# Patient Record
Sex: Female | Born: 1975
Health system: Southern US, Community
[De-identification: ages and names within clinical notes are randomized; demographics above are authoritative.]

## PROBLEM LIST (undated history)

## (undated) DIAGNOSIS — Z8619 Personal history of other infectious and parasitic diseases: Secondary | ICD-10-CM

## (undated) HISTORY — DX: Personal history of other infectious and parasitic diseases: Z86.19

---

## 1998-04-29 ENCOUNTER — Other Ambulatory Visit: Admission: RE | Admit: 1998-04-29 | Discharge: 1998-04-29 | Payer: Self-pay | Admitting: *Deleted

## 2003-09-10 ENCOUNTER — Inpatient Hospital Stay (HOSPITAL_COMMUNITY): Admission: AD | Admit: 2003-09-10 | Discharge: 2003-09-13 | Payer: Self-pay | Admitting: Obstetrics and Gynecology

## 2003-10-10 ENCOUNTER — Other Ambulatory Visit: Admission: RE | Admit: 2003-10-10 | Discharge: 2003-10-10 | Payer: Self-pay | Admitting: Obstetrics and Gynecology

## 2005-10-29 ENCOUNTER — Inpatient Hospital Stay (HOSPITAL_COMMUNITY): Admission: RE | Admit: 2005-10-29 | Discharge: 2005-11-01 | Payer: Self-pay | Admitting: Obstetrics and Gynecology

## 2012-06-28 ENCOUNTER — Ambulatory Visit: Payer: Self-pay | Admitting: Family Medicine

## 2014-06-06 IMAGING — CR DG FOOT COMPLETE 3+V*L*
1 series · 3 of 3 positions shown · non-contrast
Comparison: none

REASON FOR EXAM: left foot pain
COMMENTS:

PROCEDURE:     KDR - KDXR FOOT LT COMP W/OBLIQUES  - June 28, 2012 [DATE]
RESULT:     Comparison:  None

[Series 1: ap · 0.17mm/px · 3 of 3 slices shown]
[im 1/3]
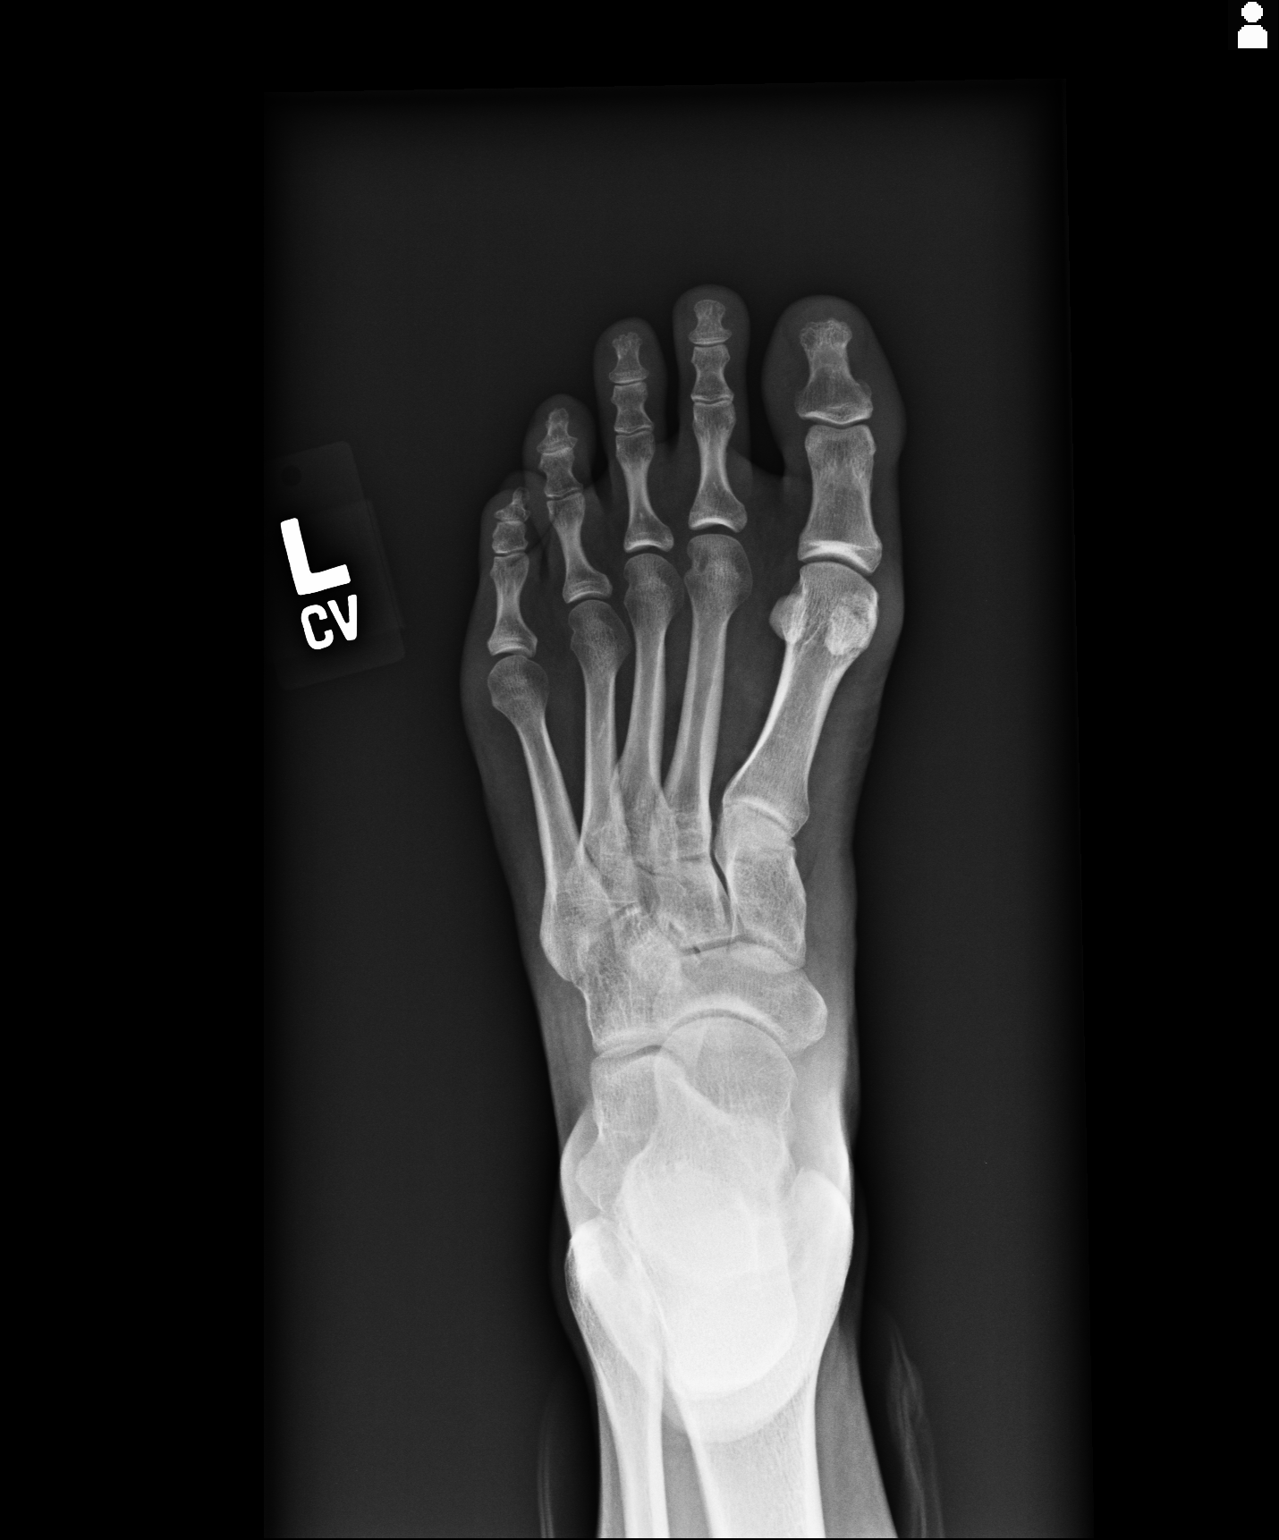
[im 2/3]
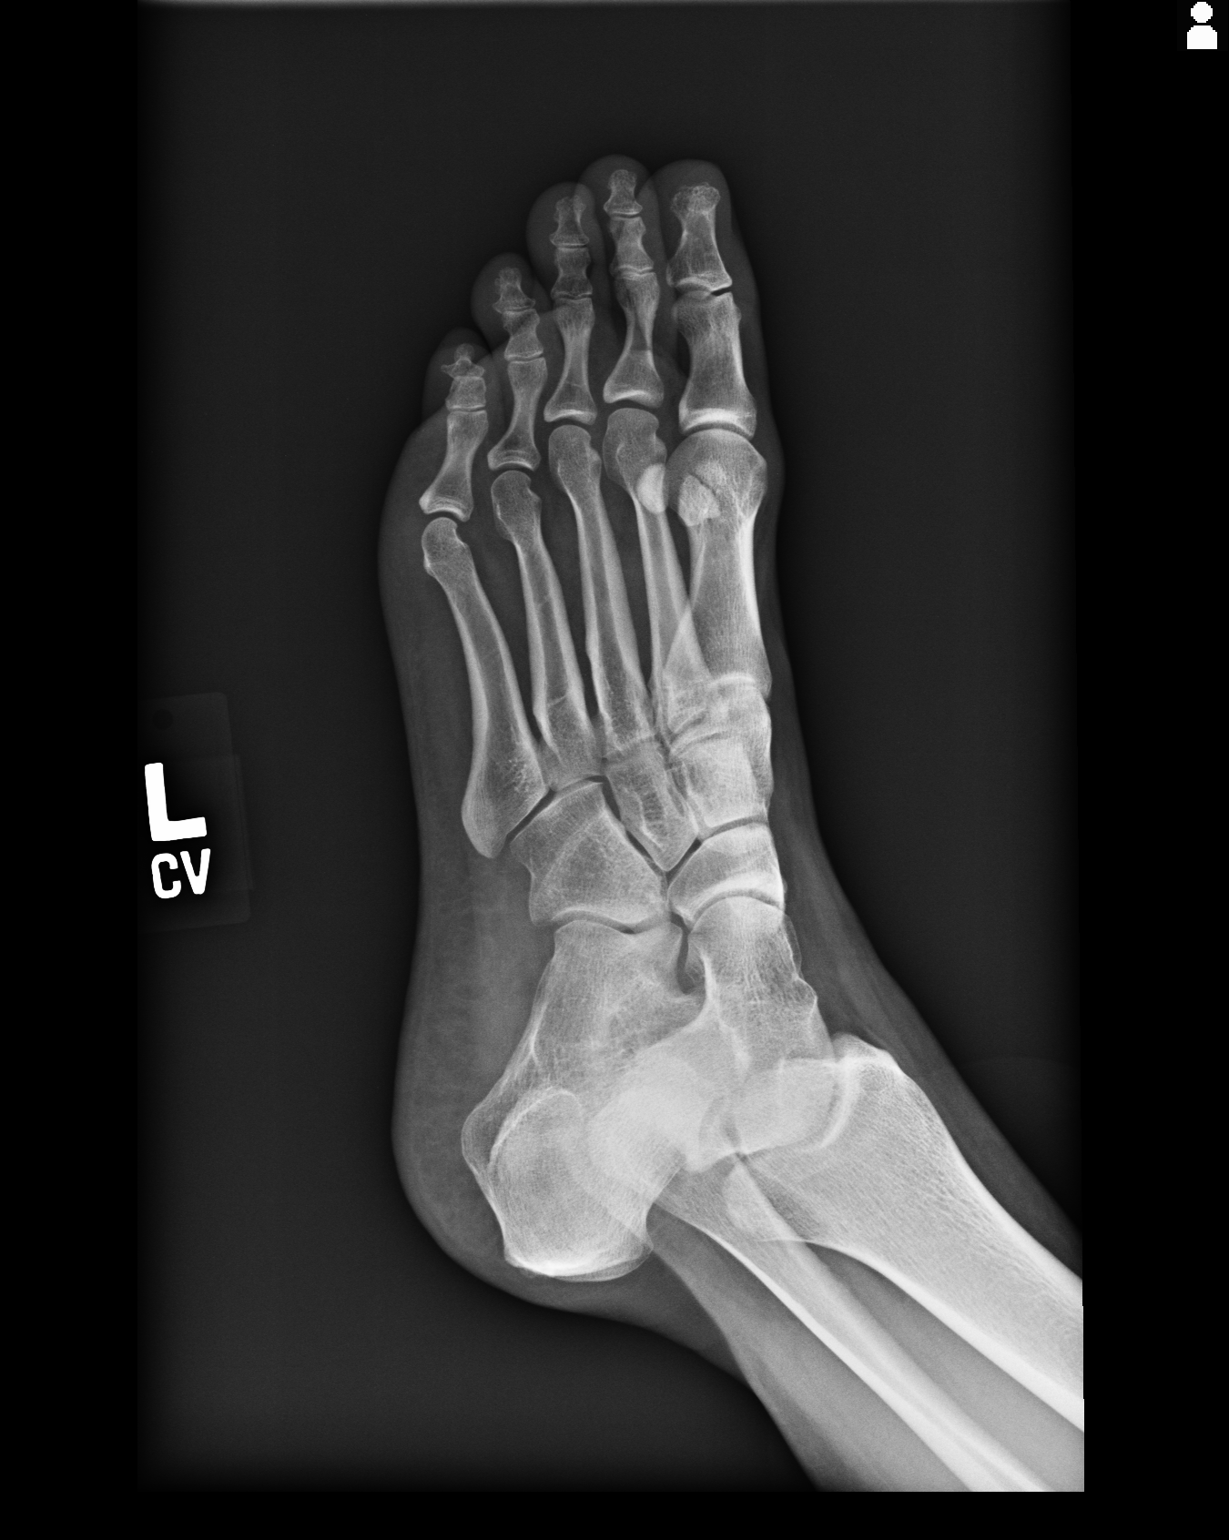
[im 3/3]
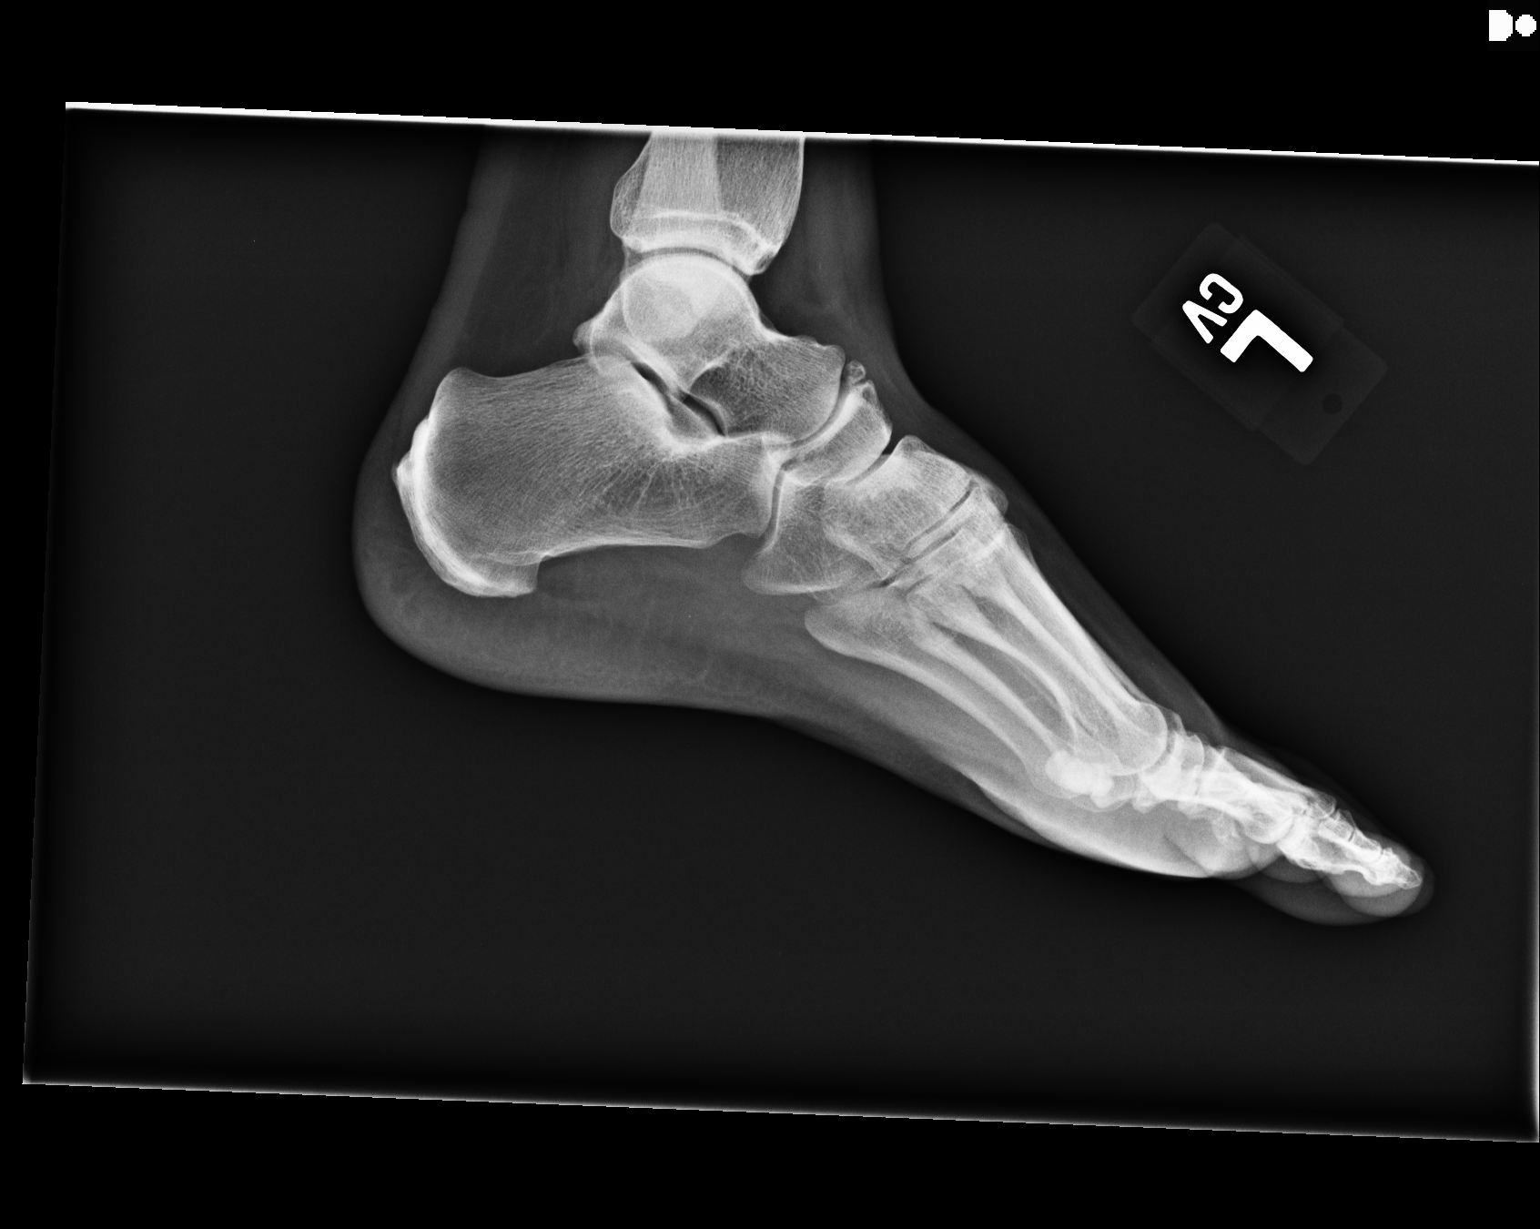

[3 of 3 positions shown; findings below may reference images not displayed]

FINDINGS: AP, oblique, and lateral views of the left foot demonstrates a transverse
lucency through the distal aspect of the medial hallux sesamoid which may
represent a fracture versus a bipartite sesamoid; correlate with point
tenderness. There is no other fracture or dislocation. There is no soft
tissue abnormality. There is no subcutaneous emphysema or radiopaque foreign
bodies.
IMPRESSION: Please see above.

[REDACTED]

## 2015-02-13 LAB — OB RESULTS CONSOLE ABO/RH: RH TYPE: POSITIVE

## 2015-02-13 LAB — OB RESULTS CONSOLE HEPATITIS B SURFACE ANTIGEN: Hepatitis B Surface Ag: NEGATIVE

## 2015-02-13 LAB — OB RESULTS CONSOLE HIV ANTIBODY (ROUTINE TESTING): HIV: NONREACTIVE

## 2015-02-13 LAB — OB RESULTS CONSOLE RPR: RPR: NONREACTIVE

## 2015-02-13 LAB — OB RESULTS CONSOLE ANTIBODY SCREEN: Antibody Screen: NEGATIVE

## 2015-02-13 LAB — OB RESULTS CONSOLE RUBELLA ANTIBODY, IGM: Rubella: IMMUNE

## 2015-02-19 LAB — OB RESULTS CONSOLE GC/CHLAMYDIA
Chlamydia: NEGATIVE
Gonorrhea: NEGATIVE

## 2015-08-14 DIAGNOSIS — O09523 Supervision of elderly multigravida, third trimester: Secondary | ICD-10-CM | POA: Diagnosis not present

## 2015-08-14 DIAGNOSIS — O99810 Abnormal glucose complicating pregnancy: Secondary | ICD-10-CM | POA: Diagnosis not present

## 2015-08-14 DIAGNOSIS — Z3A35 35 weeks gestation of pregnancy: Secondary | ICD-10-CM | POA: Diagnosis not present

## 2015-08-14 LAB — OB RESULTS CONSOLE GBS: STREP GROUP B AG: NEGATIVE

## 2015-08-15 ENCOUNTER — Other Ambulatory Visit: Payer: Self-pay | Admitting: Obstetrics and Gynecology

## 2015-08-25 ENCOUNTER — Encounter (HOSPITAL_COMMUNITY): Payer: Self-pay | Admitting: *Deleted

## 2015-08-25 ENCOUNTER — Telehealth (HOSPITAL_COMMUNITY): Payer: Self-pay | Admitting: *Deleted

## 2015-08-25 NOTE — Telephone Encounter (Signed)
Preadmission screen  

## 2015-08-26 ENCOUNTER — Encounter (HOSPITAL_COMMUNITY): Payer: Self-pay | Admitting: *Deleted

## 2015-08-26 ENCOUNTER — Telehealth (HOSPITAL_COMMUNITY): Payer: Self-pay | Admitting: *Deleted

## 2015-08-26 NOTE — Telephone Encounter (Signed)
Preadmission screen  

## 2015-08-27 ENCOUNTER — Telehealth (HOSPITAL_COMMUNITY): Payer: Self-pay | Admitting: *Deleted

## 2015-08-27 ENCOUNTER — Encounter (HOSPITAL_COMMUNITY): Payer: Self-pay | Admitting: *Deleted

## 2015-08-27 DIAGNOSIS — O365930 Maternal care for other known or suspected poor fetal growth, third trimester, not applicable or unspecified: Secondary | ICD-10-CM | POA: Diagnosis not present

## 2015-08-27 DIAGNOSIS — Z3A37 37 weeks gestation of pregnancy: Secondary | ICD-10-CM | POA: Diagnosis not present

## 2015-08-27 NOTE — Telephone Encounter (Signed)
Preadmission screen  

## 2015-09-05 ENCOUNTER — Encounter (HOSPITAL_COMMUNITY)
Admission: RE | Admit: 2015-09-05 | Discharge: 2015-09-05 | Disposition: A | Discharge status: Home / Self Care | Payer: BLUE CROSS/BLUE SHIELD | Source: Ambulatory Visit | Attending: Obstetrics and Gynecology | Admitting: Obstetrics and Gynecology

## 2015-09-05 DIAGNOSIS — Z833 Family history of diabetes mellitus: Secondary | ICD-10-CM | POA: Diagnosis not present

## 2015-09-05 DIAGNOSIS — Z811 Family history of alcohol abuse and dependence: Secondary | ICD-10-CM | POA: Diagnosis not present

## 2015-09-05 DIAGNOSIS — Z6830 Body mass index (BMI) 30.0-30.9, adult: Secondary | ICD-10-CM | POA: Diagnosis not present

## 2015-09-05 DIAGNOSIS — Z8249 Family history of ischemic heart disease and other diseases of the circulatory system: Secondary | ICD-10-CM | POA: Diagnosis not present

## 2015-09-05 DIAGNOSIS — Z825 Family history of asthma and other chronic lower respiratory diseases: Secondary | ICD-10-CM | POA: Diagnosis not present

## 2015-09-05 DIAGNOSIS — Z3A39 39 weeks gestation of pregnancy: Secondary | ICD-10-CM | POA: Diagnosis not present

## 2015-09-05 DIAGNOSIS — Z818 Family history of other mental and behavioral disorders: Secondary | ICD-10-CM | POA: Diagnosis not present

## 2015-09-05 DIAGNOSIS — Z823 Family history of stroke: Secondary | ICD-10-CM | POA: Diagnosis not present

## 2015-09-05 DIAGNOSIS — O34219 Maternal care for unspecified type scar from previous cesarean delivery: Secondary | ICD-10-CM | POA: Diagnosis not present

## 2015-09-05 DIAGNOSIS — O99214 Obesity complicating childbirth: Secondary | ICD-10-CM | POA: Diagnosis not present

## 2015-09-05 DIAGNOSIS — E669 Obesity, unspecified: Secondary | ICD-10-CM | POA: Diagnosis not present

## 2015-09-05 DIAGNOSIS — O34211 Maternal care for low transverse scar from previous cesarean delivery: Secondary | ICD-10-CM | POA: Diagnosis not present

## 2015-09-05 LAB — CBC
HCT: 36.3 % (ref 36.0–46.0)
Hemoglobin: 12.3 g/dL (ref 12.0–15.0)
MCH: 31.4 pg (ref 26.0–34.0)
MCHC: 33.9 g/dL (ref 30.0–36.0)
MCV: 92.6 fL (ref 78.0–100.0)
PLATELETS: 205 10*3/uL (ref 150–400)
RBC: 3.92 MIL/uL (ref 3.87–5.11)
RDW: 13.3 % (ref 11.5–15.5)
WBC: 8.1 10*3/uL (ref 4.0–10.5)

## 2015-09-05 LAB — TYPE AND SCREEN
ABO/RH(D): O POS
Antibody Screen: NEGATIVE

## 2015-09-05 LAB — ABO/RH: ABO/RH(D): O POS

## 2015-09-05 NOTE — Patient Instructions (Signed)
20 [name]  09/05/2015   Your procedure is scheduled on:  09/08/2015  [facility] at 0815 AM.  Pick up the phone at the desk and dial [phone].   Call this number if you have problems the morning of surgery: [phone]   Remember:   Do not eat food:After Midnight.  Do not drink clear liquids: After Midnight.  Take these medicines the morning of surgery with A SIP OF WATER: none   Do not wear jewelry, make-up or nail polish.  Do not wear lotions, powders, or perfumes. You may wear deodorant.  Do not shave 48 hours prior to surgery.  Do not bring valuables to the hospital.  [facility] is not   responsible for any belongings or valuables brought to the hospital.  Contacts, dentures or bridgework may not be worn into surgery.  Leave suitcase in the car. After surgery it may be brought to your room.  For patients admitted to the hospital, checkout time is 11:00 AM the day of              discharge.   Patients discharged the day of surgery will not be allowed to drive             home.  Name and phone number of your driver: na  Special Instructions:   Shower using CHG 2 nights before surgery and the night before surgery.  If you shower the day of surgery use CHG.  Use special wash - you have one bottle of CHG for all showers.  You should use approximately 1/3 of the bottle for each shower.   Please read over the following fact sheets that you were given:   Surgical Site Infection Prevention

## 2015-09-06 LAB — RPR: RPR: NONREACTIVE

## 2015-09-08 ENCOUNTER — Encounter (HOSPITAL_COMMUNITY)
Admission: RE | Disposition: A | Discharge status: Home / Self Care | Payer: Self-pay | Source: Ambulatory Visit | Attending: Obstetrics and Gynecology

## 2015-09-08 ENCOUNTER — Inpatient Hospital Stay (HOSPITAL_COMMUNITY)
Admission: RE | Admit: 2015-09-08 | Discharge: 2015-09-11 | DRG: 766 | Disposition: A | Discharge status: Home / Self Care | Payer: BLUE CROSS/BLUE SHIELD | Source: Ambulatory Visit | Attending: Obstetrics and Gynecology | Admitting: Obstetrics and Gynecology

## 2015-09-08 ENCOUNTER — Encounter (HOSPITAL_COMMUNITY): Payer: Self-pay

## 2015-09-08 ENCOUNTER — Inpatient Hospital Stay (HOSPITAL_COMMUNITY): Payer: BLUE CROSS/BLUE SHIELD | Admitting: Anesthesiology

## 2015-09-08 DIAGNOSIS — O34219 Maternal care for unspecified type scar from previous cesarean delivery: Secondary | ICD-10-CM | POA: Diagnosis not present

## 2015-09-08 DIAGNOSIS — Z811 Family history of alcohol abuse and dependence: Secondary | ICD-10-CM | POA: Diagnosis not present

## 2015-09-08 DIAGNOSIS — Z3A39 39 weeks gestation of pregnancy: Secondary | ICD-10-CM

## 2015-09-08 DIAGNOSIS — Z823 Family history of stroke: Secondary | ICD-10-CM | POA: Diagnosis not present

## 2015-09-08 DIAGNOSIS — Z8249 Family history of ischemic heart disease and other diseases of the circulatory system: Secondary | ICD-10-CM | POA: Diagnosis not present

## 2015-09-08 DIAGNOSIS — Z825 Family history of asthma and other chronic lower respiratory diseases: Secondary | ICD-10-CM

## 2015-09-08 DIAGNOSIS — Z6830 Body mass index (BMI) 30.0-30.9, adult: Secondary | ICD-10-CM

## 2015-09-08 DIAGNOSIS — O99214 Obesity complicating childbirth: Secondary | ICD-10-CM | POA: Diagnosis present

## 2015-09-08 DIAGNOSIS — Z833 Family history of diabetes mellitus: Secondary | ICD-10-CM

## 2015-09-08 DIAGNOSIS — Z818 Family history of other mental and behavioral disorders: Secondary | ICD-10-CM

## 2015-09-08 DIAGNOSIS — O34211 Maternal care for low transverse scar from previous cesarean delivery: Principal | ICD-10-CM | POA: Diagnosis present

## 2015-09-08 DIAGNOSIS — Z98891 History of uterine scar from previous surgery: Secondary | ICD-10-CM

## 2015-09-08 DIAGNOSIS — E669 Obesity, unspecified: Secondary | ICD-10-CM | POA: Diagnosis present

## 2015-09-08 SURGERY — Surgical Case
Anesthesia: Spinal

## 2015-09-08 MED ORDER — DIBUCAINE 1 % RE OINT: 1.0000 "application " | TOPICAL_OINTMENT | RECTAL | Status: DC | PRN

## 2015-09-08 MED ORDER — MENTHOL 3 MG MT LOZG: 1.0000 | LOZENGE | OROMUCOSAL | Status: DC | PRN

## 2015-09-08 MED ORDER — SODIUM CHLORIDE 0.9% FLUSH: 3.0000 mL | INTRAVENOUS | Status: DC | PRN

## 2015-09-08 MED ORDER — ONDANSETRON HCL 4 MG/2ML IJ SOLN
INTRAMUSCULAR | Status: DC | PRN
  Administered 2015-09-08: 4 mg via INTRAVENOUS

## 2015-09-08 MED ORDER — ACETAMINOPHEN 500 MG PO TABS
1000.0000 mg | ORAL_TABLET | Freq: Four times a day (QID) | ORAL | Status: AC
  Administered 2015-09-08 – 2015-09-09 (×4): 1000 mg via ORAL
  Filled 2015-09-08 (×4): qty 2

## 2015-09-08 MED ORDER — CITRIC ACID-SODIUM CITRATE 334-500 MG/5ML PO SOLN
ORAL | Status: AC
  Administered 2015-09-08: 30 mL via ORAL
  Filled 2015-09-08: qty 15

## 2015-09-08 MED ORDER — SCOPOLAMINE 1 MG/3DAYS TD PT72
MEDICATED_PATCH | TRANSDERMAL | Status: AC
  Administered 2015-09-08: 1.5 mg via TRANSDERMAL
  Filled 2015-09-08: qty 1

## 2015-09-08 MED ORDER — MORPHINE SULFATE (PF) 0.5 MG/ML IJ SOLN
INTRAMUSCULAR | Status: DC | PRN
  Administered 2015-09-08: .2 mg via INTRATHECAL

## 2015-09-08 MED ORDER — PRENATAL MULTIVITAMIN CH
1.0000 | ORAL_TABLET | Freq: Every day | ORAL | Status: DC
  Administered 2015-09-09 – 2015-09-11 (×3): 1 via ORAL
  Filled 2015-09-08 (×3): qty 1

## 2015-09-08 MED ORDER — ONDANSETRON HCL 4 MG/2ML IJ SOLN
INTRAMUSCULAR | Status: AC
  Filled 2015-09-08: qty 2

## 2015-09-08 MED ORDER — NALOXONE HCL 2 MG/2ML IJ SOSY: 1.0000 ug/kg/h | PREFILLED_SYRINGE | INTRAVENOUS | Status: DC | PRN

## 2015-09-08 MED ORDER — SENNOSIDES-DOCUSATE SODIUM 8.6-50 MG PO TABS
2.0000 | ORAL_TABLET | ORAL | Status: DC
  Administered 2015-09-08 – 2015-09-09 (×2): 2 via ORAL
  Filled 2015-09-08 (×3): qty 2

## 2015-09-08 MED ORDER — SIMETHICONE 80 MG PO CHEW: 80.0000 mg | CHEWABLE_TABLET | ORAL | Status: DC | PRN

## 2015-09-08 MED ORDER — SIMETHICONE 80 MG PO CHEW
80.0000 mg | CHEWABLE_TABLET | ORAL | Status: DC
  Administered 2015-09-08 – 2015-09-11 (×3): 80 mg via ORAL
  Filled 2015-09-08 (×3): qty 1

## 2015-09-08 MED ORDER — PHENYLEPHRINE HCL 10 MG/ML IJ SOLN: INTRAMUSCULAR | Status: DC | PRN

## 2015-09-08 MED ORDER — NALOXONE HCL 0.4 MG/ML IJ SOLN
0.4000 mg | INTRAMUSCULAR | Status: DC | PRN
Start: 2015-09-08 — End: 2015-09-09

## 2015-09-08 MED ORDER — ONDANSETRON HCL 4 MG/2ML IJ SOLN: 4.0000 mg | Freq: Three times a day (TID) | INTRAMUSCULAR | Status: DC | PRN

## 2015-09-08 MED ORDER — KETOROLAC TROMETHAMINE 30 MG/ML IJ SOLN
30.0000 mg | Freq: Four times a day (QID) | INTRAMUSCULAR | Status: DC | PRN
  Administered 2015-09-08: 30 mg via INTRAMUSCULAR

## 2015-09-08 MED ORDER — IBUPROFEN 600 MG PO TABS
600.0000 mg | ORAL_TABLET | Freq: Four times a day (QID) | ORAL | Status: DC
  Administered 2015-09-08 – 2015-09-11 (×11): 600 mg via ORAL
  Filled 2015-09-08 (×11): qty 1

## 2015-09-08 MED ORDER — SODIUM CHLORIDE 0.9 % IR SOLN
Status: DC | PRN
  Administered 2015-09-08: 1

## 2015-09-08 MED ORDER — PHENYLEPHRINE 8 MG IN D5W 100 ML (0.08MG/ML) PREMIX OPTIME
INJECTION | INTRAVENOUS | Status: AC
  Filled 2015-09-08: qty 100

## 2015-09-08 MED ORDER — DIPHENHYDRAMINE HCL 50 MG/ML IJ SOLN: 12.5000 mg | INTRAMUSCULAR | Status: DC | PRN

## 2015-09-08 MED ORDER — DIPHENHYDRAMINE HCL 25 MG PO CAPS: 25.0000 mg | ORAL_CAPSULE | ORAL | Status: DC | PRN

## 2015-09-08 MED ORDER — COCONUT OIL OIL: 1.0000 "application " | TOPICAL_OIL | Status: DC | PRN

## 2015-09-08 MED ORDER — NALBUPHINE HCL 10 MG/ML IJ SOLN: 5.0000 mg | INTRAMUSCULAR | Status: DC | PRN

## 2015-09-08 MED ORDER — CITRIC ACID-SODIUM CITRATE 334-500 MG/5ML PO SOLN
30.0000 mL | Freq: Once | ORAL | Status: AC
  Administered 2015-09-08: 30 mL via ORAL

## 2015-09-08 MED ORDER — SCOPOLAMINE 1 MG/3DAYS TD PT72
1.0000 | MEDICATED_PATCH | Freq: Once | TRANSDERMAL | Status: DC
  Administered 2015-09-08: 1.5 mg via TRANSDERMAL

## 2015-09-08 MED ORDER — DIPHENHYDRAMINE HCL 25 MG PO CAPS: 25.0000 mg | ORAL_CAPSULE | Freq: Four times a day (QID) | ORAL | Status: DC | PRN

## 2015-09-08 MED ORDER — FENTANYL CITRATE (PF) 100 MCG/2ML IJ SOLN
INTRAMUSCULAR | Status: DC | PRN
  Administered 2015-09-08: 10 ug via EPIDURAL

## 2015-09-08 MED ORDER — MORPHINE SULFATE (PF) 0.5 MG/ML IJ SOLN
INTRAMUSCULAR | Status: AC
  Filled 2015-09-08: qty 10

## 2015-09-08 MED ORDER — PHENYLEPHRINE 8 MG IN D5W 100 ML (0.08MG/ML) PREMIX OPTIME
INJECTION | INTRAVENOUS | Status: DC | PRN
  Administered 2015-09-08: 60 ug/min via INTRAVENOUS

## 2015-09-08 MED ORDER — BUPIVACAINE HCL (PF) 0.25 % IJ SOLN
INTRAMUSCULAR | Status: AC
  Filled 2015-09-08: qty 10

## 2015-09-08 MED ORDER — TETANUS-DIPHTH-ACELL PERTUSSIS 5-2.5-18.5 LF-MCG/0.5 IM SUSP: 0.5000 mL | Freq: Once | INTRAMUSCULAR | Status: DC

## 2015-09-08 MED ORDER — FENTANYL CITRATE (PF) 100 MCG/2ML IJ SOLN
INTRAMUSCULAR | Status: AC
  Filled 2015-09-08: qty 2

## 2015-09-08 MED ORDER — OXYTOCIN 10 UNIT/ML IJ SOLN
INTRAMUSCULAR | Status: AC
  Filled 2015-09-08: qty 4

## 2015-09-08 MED ORDER — LACTATED RINGERS IV SOLN
INTRAVENOUS | Status: DC | PRN
  Administered 2015-09-08 (×3): via INTRAVENOUS

## 2015-09-08 MED ORDER — BUPIVACAINE HCL (PF) 0.25 % IJ SOLN
INTRAMUSCULAR | Status: DC | PRN
  Administered 2015-09-08: 10 mL

## 2015-09-08 MED ORDER — LACTATED RINGERS IV SOLN
INTRAVENOUS | Status: DC | PRN
  Administered 2015-09-08: 10:00:00 via INTRAVENOUS

## 2015-09-08 MED ORDER — SIMETHICONE 80 MG PO CHEW
80.0000 mg | CHEWABLE_TABLET | Freq: Three times a day (TID) | ORAL | Status: DC
  Administered 2015-09-08 – 2015-09-11 (×9): 80 mg via ORAL
  Filled 2015-09-08 (×9): qty 1

## 2015-09-08 MED ORDER — LACTATED RINGERS IV SOLN
INTRAVENOUS | Status: DC
  Administered 2015-09-08 – 2015-09-09 (×2): via INTRAVENOUS

## 2015-09-08 MED ORDER — ACETAMINOPHEN 325 MG PO TABS
650.0000 mg | ORAL_TABLET | ORAL | Status: DC | PRN
  Administered 2015-09-10 – 2015-09-11 (×4): 650 mg via ORAL
  Filled 2015-09-08 (×4): qty 2

## 2015-09-08 MED ORDER — CEFAZOLIN SODIUM-DEXTROSE 2-4 GM/100ML-% IV SOLN
2.0000 g | INTRAVENOUS | Status: AC
  Administered 2015-09-08: 2 g via INTRAVENOUS
  Filled 2015-09-08: qty 100

## 2015-09-08 MED ORDER — OXYCODONE-ACETAMINOPHEN 5-325 MG PO TABS: 2.0000 | ORAL_TABLET | ORAL | Status: DC | PRN

## 2015-09-08 MED ORDER — NALBUPHINE HCL 10 MG/ML IJ SOLN: 5.0000 mg | Freq: Once | INTRAMUSCULAR | Status: DC | PRN

## 2015-09-08 MED ORDER — PHENYLEPHRINE 40 MCG/ML (10ML) SYRINGE FOR IV PUSH (FOR BLOOD PRESSURE SUPPORT)
PREFILLED_SYRINGE | INTRAVENOUS | Status: AC
Start: 2015-09-08 — End: 2015-09-08
  Filled 2015-09-08: qty 10

## 2015-09-08 MED ORDER — OXYTOCIN 40 UNITS IN LACTATED RINGERS INFUSION - SIMPLE MED
INTRAVENOUS | Status: DC | PRN
  Administered 2015-09-08: 40 [IU] via INTRAVENOUS

## 2015-09-08 MED ORDER — LACTATED RINGERS IV SOLN: Freq: Once | INTRAVENOUS | Status: DC

## 2015-09-08 MED ORDER — KETOROLAC TROMETHAMINE 30 MG/ML IJ SOLN
30.0000 mg | Freq: Four times a day (QID) | INTRAMUSCULAR | Status: DC | PRN
  Administered 2015-09-08: 30 mg via INTRAVENOUS
  Filled 2015-09-08: qty 1

## 2015-09-08 MED ORDER — OXYCODONE-ACETAMINOPHEN 5-325 MG PO TABS
1.0000 | ORAL_TABLET | ORAL | Status: DC | PRN
  Filled 2015-09-08: qty 1

## 2015-09-08 MED ORDER — METHYLERGONOVINE MALEATE 0.2 MG/ML IJ SOLN: 0.2000 mg | INTRAMUSCULAR | Status: DC | PRN

## 2015-09-08 MED ORDER — BUPIVACAINE IN DEXTROSE 0.75-8.25 % IT SOLN
INTRATHECAL | Status: DC | PRN
  Administered 2015-09-08: 1.6 mL via INTRATHECAL

## 2015-09-08 MED ORDER — KETOROLAC TROMETHAMINE 30 MG/ML IJ SOLN
INTRAMUSCULAR | Status: AC
  Administered 2015-09-08: 30 mg via INTRAVENOUS
  Filled 2015-09-08: qty 1

## 2015-09-08 MED ORDER — OXYTOCIN 10 UNIT/ML IJ SOLN: 2.5000 [IU]/h | INTRAVENOUS | Status: DC

## 2015-09-08 MED ORDER — CEFAZOLIN SODIUM-DEXTROSE 2-3 GM-% IV SOLR
INTRAVENOUS | Status: AC
  Filled 2015-09-08: qty 50

## 2015-09-08 MED ORDER — ZOLPIDEM TARTRATE 5 MG PO TABS: 5.0000 mg | ORAL_TABLET | Freq: Every evening | ORAL | Status: DC | PRN

## 2015-09-08 MED ORDER — METHYLERGONOVINE MALEATE 0.2 MG PO TABS: 0.2000 mg | ORAL_TABLET | ORAL | Status: DC | PRN

## 2015-09-08 MED ORDER — WITCH HAZEL-GLYCERIN EX PADS: 1.0000 "application " | MEDICATED_PAD | CUTANEOUS | Status: DC | PRN

## 2015-09-08 SURGICAL SUPPLY — 35 items
CHLORAPREP W/TINT 26ML (MISCELLANEOUS) ×3 IMPLANT
CLAMP CORD UMBIL (MISCELLANEOUS) IMPLANT
CLOTH BEACON ORANGE TIMEOUT ST (SAFETY) ×3 IMPLANT
CONTAINER PREFILL 10% NBF 15ML (MISCELLANEOUS) IMPLANT
DRSG OPSITE POSTOP 4X10 (GAUZE/BANDAGES/DRESSINGS) ×3 IMPLANT
ELECT REM PT RETURN 9FT ADLT (ELECTROSURGICAL) ×3
ELECTRODE REM PT RTRN 9FT ADLT (ELECTROSURGICAL) ×1 IMPLANT
EXTRACTOR VACUUM M CUP 4 TUBE (SUCTIONS) IMPLANT
EXTRACTOR VACUUM M CUP 4' TUBE (SUCTIONS)
GLOVE BIO SURGEON STRL SZ7.5 (GLOVE) ×3 IMPLANT
GLOVE BIOGEL PI IND STRL 7.0 (GLOVE) ×2 IMPLANT
GLOVE BIOGEL PI INDICATOR 7.0 (GLOVE) ×4
GOWN STRL REUS W/TWL LRG LVL3 (GOWN DISPOSABLE) ×9 IMPLANT
KIT ABG SYR 3ML LUER SLIP (SYRINGE) IMPLANT
NDL HYPO 25X5/8 SAFETYGLIDE (NEEDLE) IMPLANT
NDL SPNL 20GX3.5 QUINCKE YW (NEEDLE) IMPLANT
NEEDLE HYPO 22GX1.5 SAFETY (NEEDLE) ×5 IMPLANT
NEEDLE HYPO 25X5/8 SAFETYGLIDE (NEEDLE) IMPLANT
NEEDLE SPNL 20GX3.5 QUINCKE YW (NEEDLE) IMPLANT
NS IRRIG 1000ML POUR BTL (IV SOLUTION) ×3 IMPLANT
PACK C SECTION WH (CUSTOM PROCEDURE TRAY) ×3 IMPLANT
PENCIL SMOKE EVAC W/HOLSTER (ELECTROSURGICAL) ×3 IMPLANT
SUT MNCRL 0 VIOLET CTX 36 (SUTURE) ×2 IMPLANT
SUT MNCRL AB 3-0 PS2 27 (SUTURE) IMPLANT
SUT MON AB 2-0 CT1 27 (SUTURE) ×3 IMPLANT
SUT MON AB-0 CT1 36 (SUTURE) ×6 IMPLANT
SUT MONOCRYL 0 CTX 36 (SUTURE) ×4
SUT PLAIN 0 NONE (SUTURE) IMPLANT
SUT PLAIN 2 0 (SUTURE)
SUT PLAIN 2 0 XLH (SUTURE) IMPLANT
SUT PLAIN ABS 2-0 CT1 27XMFL (SUTURE) IMPLANT
SYR 20CC LL (SYRINGE) IMPLANT
SYR CONTROL 10ML LL (SYRINGE) ×5 IMPLANT
TOWEL OR 17X24 6PK STRL BLUE (TOWEL DISPOSABLE) ×3 IMPLANT
TRAY FOLEY CATH SILVER 14FR (SET/KITS/TRAYS/PACK) ×3 IMPLANT

## 2015-09-08 NOTE — Anesthesia Procedure Notes (Signed)
Spinal Patient location during procedure: OR Staffing Anesthesiologist: Indiana Gamero EDWARD Performed by: anesthesiologist  Preanesthetic Checklist Completed: patient identified, surgical consent, pre-op evaluation, timeout performed, IV checked, risks and benefits discussed and monitors and equipment checked Spinal Block Patient position: sitting Prep: site prepped and draped and DuraPrep Patient monitoring: continuous pulse ox and blood pressure Approach: midline Location: L3-4 Needle Needle type: Pencan  Needle gauge: 24 G Assessment Sensory level: T4 Additional Notes Functioning IV was confirmed and monitors were applied. Sterile prep and drape, including hand hygiene, mask and sterile gloves were used. The patient was positioned and the spine was prepped. The skin was anesthetized with lidocaine.  Free flow of clear CSF was obtained prior to injecting local anesthetic into the CSF.  The spinal needle aspirated freely following injection.  The needle was carefully withdrawn.  The patient tolerated the procedure well. Consent was obtained prior to procedure with all questions answered and concerns addressed. Risks including but not limited to bleeding, infection, nerve damage, paralysis, failed block, inadequate analgesia, allergic reaction, high spinal, itching and headache were discussed and the patient wished to proceed.   Giancarlo Askren, MD   

## 2015-09-08 NOTE — Progress Notes (Signed)
Patient ID: Nancy Hanna, female   DOB: December 04, 1975, 40 y.o.   MRN: [id number] Patient seen and examined. Consent witnessed and signed. No changes noted. Update completed.

## 2015-09-08 NOTE — Op Note (Signed)
Cesarean Section Procedure Note  Indications: previous uterine incision kerr x2  Pre-operative Diagnosis: 39 week 0 day pregnancy.  Post-operative Diagnosis: same  Surgeon: Lenoard AdenAAVON,Purvi Ruehl J   Assistants: RNFA  Anesthesia: Local anesthesia 0.25.% bupivacaine and Spinal anesthesia  ASA Class: 2  Procedure Details  The patient was seen in the Holding Room. The risks, benefits, complications, treatment options, and expected outcomes were discussed with the patient.  The patient concurred with the proposed plan, giving informed consent. The risks of anesthesia, infection, bleeding and possible injury to other organs discussed. Injury to bowel, bladder, or ureter with possible need for repair discussed. Possible need for transfusion with secondary risks of hepatitis or HIV acquisition discussed. Post operative complications to include but not limited to DVT, PE and Pneumonia noted. The site of surgery properly noted/marked. The patient was taken to Operating Room # 9, identified as Nancy Hanna and the procedure verified as C-Section Delivery. A Time Out was held and the above information confirmed.  After induction of anesthesia, the patient was draped and prepped in the usual sterile manner. A Pfannenstiel incision was made and carried down through the subcutaneous tissue to the fascia. Fascial incision was made and extended transversely using Mayo scissors. The fascia was separated from the underlying rectus tissue superiorly and inferiorly. The peritoneum was identified and entered. Peritoneal incision was extended longitudinally. The utero-vesical peritoneal reflection was incised transversely and the bladder flap was bluntly freed from the lower uterine segment. A low transverse uterine incision(Kerr hysterotomy) was made. Delivered from OT presentation was a  Female with Apgar scores of 9 at one minute and 9 at five minutes. Bulb suctioning gently performed. Neonatal team in attendance.After  the umbilical cord was clamped and cut cord blood was obtained for evaluation. The placenta was removed intact and appeared normal. The uterus was curetted with a dry lap pack. Good hemostasis was noted.The uterine outline, tubes and ovaries appeared normal. The uterine incision was closed with running locked sutures of 0 Monocryl x 2 layers. Hemostasis was observed. Lavage was carried out until clear.The parietal peritoneum was closed with a running 2-0 Monocryl suture. The fascia was then reapproximated with running sutures of 0 Monocryl. The skin was reapproximated with 4-0 vicryl after Tennyson closure with 2-0 plain.  Instrument, sponge, and needle counts were correct prior the abdominal closure and at the conclusion of the case.   Findings: FTLM, anterior placenta, nl tubes and ovaries  Estimated Blood Loss:  500         Drains: foley                 Specimens: placenta                 Complications:  None; patient tolerated the procedure well.         Disposition: PACU - hemodynamically stable.         Condition: stable  Attending Attestation: I performed the procedure.

## 2015-09-08 NOTE — H&P (Signed)
Nancy Hanna is a 40 y.o. female presenting for rpt csection. Maternal Medical History:  Fetal activity: Perceived fetal activity is normal.   Last perceived fetal movement was within the past hour.    Prenatal complications: no prenatal complications Prenatal Complications - Diabetes: none.    OB History    Gravida Para Term Preterm AB TAB SAB Ectopic Multiple Living   3 2 1       2      Past Medical History  Diagnosis Date  . Hx of varicella    Past Surgical History  Procedure Laterality Date  . Cesarean section     Family History: family history includes Alcohol abuse in her brother; Bipolar disorder in her brother; COPD in her maternal grandmother; Cancer in her maternal grandmother and mother; Depression in her paternal grandmother; Diabetes in her father; Drug abuse in her brother and maternal grandmother; Heart attack in her father and maternal grandfather; Heart failure in her maternal grandfather; Hypertension in her maternal grandfather; Hypothyroidism in her mother; Migraines in her maternal grandmother and mother; Rheum arthritis in her paternal grandmother; Seizures in her brother; Stroke in her maternal grandfather. Social History:  reports that she has never smoked. She does not have any smokeless tobacco history on file. She reports that she does not drink alcohol or use illicit drugs.   Prenatal Transfer Tool  Maternal Diabetes: No Genetic Screening: Normal Maternal Ultrasounds/Referrals: Normal Fetal Ultrasounds or other Referrals:  None Maternal Substance Abuse:  No Significant Maternal Medications:  None Significant Maternal Lab Results:  None Other Comments:  None  Review of Systems  Constitutional: Negative.   All other systems reviewed and are negative.     There were no vitals taken for this visit. Maternal Exam:  Uterine Assessment: Contraction strength is mild.  Contraction frequency is rare.   Abdomen: Patient reports no abdominal  tenderness. Surgical scars: low transverse.   Fetal presentation: vertex  Introitus: Normal vulva. Normal vagina.  Ferning test: not done.  Nitrazine test: not done. Amniotic fluid character: not assessed.  Pelvis: of concern for delivery.   Cervix: Cervix evaluated by digital exam.     Physical Exam  Nursing note and vitals reviewed. Constitutional: She is oriented to person, place, and time. She appears well-developed and well-nourished.  HENT:  Head: Normocephalic.  Neck: Normal range of motion. Neck supple.  Cardiovascular: Normal rate and regular rhythm.   Respiratory: Effort normal and breath sounds normal.  GI: Soft. Bowel sounds are normal.  Genitourinary: Vagina normal and uterus normal.  Musculoskeletal: Normal range of motion.  Neurological: She is alert and oriented to person, place, and time. She has normal reflexes.  Skin: Skin is warm and dry.  Psychiatric: She has a normal mood and affect.    Prenatal labs: ABO, Rh: --/--/O POS, O POS (04/28 1055) Antibody: NEG (04/28 1055) Rubella: Immune (10/06 0000) RPR: Non Reactive (04/28 1055)  HBsAg: Negative (10/06 0000)  HIV: Non-reactive (10/06 0000)  GBS: Negative (04/06 0000)   Assessment/Plan: 39 wk iUP Previous csection x 2 Rpt csection. Consent done. Risks vs benefits of surgery discussed. Complications outlined and acknowledged.   Charlot Gouin J 09/08/2015, 8:12 AM

## 2015-09-08 NOTE — Transfer of Care (Signed)
Immediate Anesthesia Transfer of Care Note  Patient: Nancy Hanna  Procedure(s) Performed: Procedure(s) with comments: Repeat CESAREAN SECTION (N/A) - EDD: 09/15/15 Felix Ahmadiracey or Heather to RNFA  Patient Location: PACU  Anesthesia Type:Spinal  Level of Consciousness: awake, alert  and oriented  Airway & Oxygen Therapy: Patient Spontanous Breathing  Post-op Assessment: Report given to RN and Post -op Vital signs reviewed and stable  Post vital signs: Reviewed and stable  Last Vitals:  Filed Vitals:   09/08/15 0833  BP: 144/91  Pulse: 80  Temp: 36.7 C  Resp: 16    Last Pain: There were no vitals filed for this visit.       Complications: No apparent anesthesia complications

## 2015-09-08 NOTE — Consult Note (Signed)
Neonatology Note:   Attendance at C-section:    I was asked by Dr. Taavon to attend this repeat C/S at term. The mother is a G3P2, GBS neg with good prenatal care. ROM 0 hours before delivery, fluid clear. Infant vigorous with good spontaneous cry and tone. Needed only minimal bulb suctioning. Ap 8/9. Lungs clear to ausc in DR. Midline sacral dimple noted, base visualized. To CN to care of Pediatrician.    Nancy Hanna C. Carsyn Taubman, MD  

## 2015-09-08 NOTE — Anesthesia Preprocedure Evaluation (Addendum)
Anesthesia Evaluation  Patient identified by MRN, date of birth, ID band Patient awake    Reviewed: Allergy & Precautions, NPO status , Patient's Chart, lab work & pertinent test results  Airway Mallampati: II  TM Distance: >3 FB Neck ROM: Full    Dental  (+) Teeth Intact, Dental Advisory Given   Pulmonary neg pulmonary ROS,    Pulmonary exam normal breath sounds clear to auscultation       Cardiovascular Exercise Tolerance: Good negative cardio ROS Normal cardiovascular exam Rhythm:Regular Rate:Normal     Neuro/Psych negative neurological ROS  negative psych ROS   GI/Hepatic negative GI ROS, Neg liver ROS,   Endo/Other  Obesity   Renal/GU negative Renal ROS     Musculoskeletal negative musculoskeletal ROS (+)   Abdominal   Peds  Hematology negative hematology ROS (+) Plt 205k on 09/05/15   Anesthesia Other Findings Day of surgery medications reviewed with the patient.  Reproductive/Obstetrics (+) Pregnancy H/o 2 previous C-sections                              Anesthesia Physical Anesthesia Plan  ASA: II  Anesthesia Plan: Spinal   Post-op Pain Management:    Induction:   Airway Management Planned: Nasal Cannula  Additional Equipment:   Intra-op Plan:   Post-operative Plan:   Informed Consent: I have reviewed the patients History and Physical, chart, labs and discussed the procedure including the risks, benefits and alternatives for the proposed anesthesia with the patient or authorized representative who has indicated his/her understanding and acceptance.   Dental advisory given  Plan Discussed with: CRNA, Anesthesiologist and Surgeon  Anesthesia Plan Comments: (Discussed risks and benefits of and differences between spinal and general. Discussed risks of spinal including headache, backache, failure, bleeding, infection, and nerve damage. Patient consents to spinal.  Questions answered. Coagulation studies and platelet count acceptable.)       Anesthesia Quick Evaluation

## 2015-09-08 NOTE — Anesthesia Postprocedure Evaluation (Signed)
Anesthesia Post Note  Patient: Nancy Hanna  Procedure(s) Performed: Procedure(s) (LRB): Repeat CESAREAN SECTION (N/A)  Patient location during evaluation: PACU Anesthesia Type: Spinal Level of consciousness: awake and alert and oriented Pain management: pain level controlled Vital Signs Assessment: post-procedure vital signs reviewed and stable Respiratory status: spontaneous breathing, nonlabored ventilation and respiratory function stable Cardiovascular status: blood pressure returned to baseline and stable Postop Assessment: no signs of nausea or vomiting, patient able to bend at knees, spinal receding, no backache and no headache Anesthetic complications: no     Last Vitals:  Filed Vitals:   09/08/15 1220 09/08/15 1230  BP:  133/86  Pulse: 58 58  Temp:    Resp: 18 17    Last Pain: There were no vitals filed for this visit. Pain Goal:                 Wisdom Seybold A.

## 2015-09-09 ENCOUNTER — Encounter (HOSPITAL_COMMUNITY): Payer: Self-pay | Admitting: Obstetrics and Gynecology

## 2015-09-09 LAB — CBC
HEMATOCRIT: 27.4 % — AB (ref 36.0–46.0)
Hemoglobin: 9.2 g/dL — ABNORMAL LOW (ref 12.0–15.0)
MCH: 31 pg (ref 26.0–34.0)
MCHC: 33.6 g/dL (ref 30.0–36.0)
MCV: 92.3 fL (ref 78.0–100.0)
PLATELETS: 151 10*3/uL (ref 150–400)
RBC: 2.97 MIL/uL — ABNORMAL LOW (ref 3.87–5.11)
RDW: 13.4 % (ref 11.5–15.5)
WBC: 7.8 10*3/uL (ref 4.0–10.5)

## 2015-09-09 LAB — BIRTH TISSUE RECOVERY COLLECTION (PLACENTA DONATION)

## 2015-09-09 NOTE — Progress Notes (Signed)
POSTOPERATIVE DAY # 1 S/P CS - repeat elective  S:         Reports feeling well - just a little sore             Tolerating po intake / no nausea / no vomiting / no flatus / no BM             Bleeding is light             Pain controlled withmotrin             Up ad lib / ambulatory/ voiding QS  Newborn breast feeding    O:  VS: BP 111/39 mmHg  Pulse 70  Temp(Src) 97.9 F (36.6 C) (Oral)  Resp 16  SpO2 98%  LMP 12/09/2014  Breastfeeding? Unknown   LABS:               Recent Labs  09/09/15 0604  WBC 7.8  HGB 9.2*  PLT 151               Bloodtype: --/--/O POS, O POS (04/28 1055)  Rubella: Immune (10/06 0000)                                             I&O: Intake/Output      05/01 0701 - 05/02 0700 05/02 0701 - 05/03 0700   P.O. 2320    I.V. 5014.6    Total Intake 7334.6     Urine 3025    Blood 600    Total Output 3625     Net +3709.6                       Physical Exam:             Alert and Oriented X3  Lungs: Clear and unlabored  Heart: regular rate and rhythm / no mumurs  Abdomen: soft, non-tender, non-distended, hypoactive BS             Fundus: firm, non-tender, Ueven             Dressing intact              Incision:  approximated with suture / no erythema / no ecchymosis / marked drainage far left lateral 1/8 saturated  Perineum: intact  Lochia: light  Extremities: 1+ pedal edema, no calf pain or tenderness  A:        POD # 1 S/P CS - repeat             P:        Routine postoperative care                 Marlinda MikeBAILEY, Arik Husmann CNM, MSN, [facility]ACNM 09/09/2015, 7:58 AM

## 2015-09-09 NOTE — Lactation Note (Signed)
This note was copied from a baby's chart. Lactation Consultation Note Follow up visit at 33 hours of age.  RN assisted with syringe feeding of 4ml of formula with NS at breast about 1 hour ago.  Mom attempting feeding now with NS already applied and baby mouthing breast.  Baby is sleepy and not eager to eat.  LC assisted with syringe feeding and baby had strong rhythmic sucking with flow of Formula from syringe and stopped when done. Baby took about 10mls with several breaks to burp.  Baby appears more relaxed, but is noted to be jittery consistent with previous charting and RN aware.  LC assisted with set up of DEBP, mom has used once and needed follow up on teaching.  Encouraged mom to post pump after feedings and work on hand expression after.  Mom to give EBM before formula if she is able to collect colostrum.   Plan mom to use NS for feedings with supplement of 10-3215mls with each feeding and post pump.   Baby remains a poor feeder even with supplementation.    Patient Name: Nancy Hanna WUJWJ'XToday's Date: 09/09/2015 Reason for consult: Follow-up assessment;Difficult latch   Maternal Data Has patient been taught Hand Expression?: Yes Does the patient have breastfeeding experience prior to this delivery?: Yes  Feeding Feeding Type: Formula Length of feed: 5 min  LATCH Score/Interventions Latch: Repeated attempts needed to sustain latch, nipple held in mouth throughout feeding, stimulation needed to elicit sucking reflex. Intervention(s): Adjust position;Assist with latch;Breast massage;Breast compression  Audible Swallowing: A few with stimulation (swallows with formula in syringe) Intervention(s): Skin to skin;Hand expression Intervention(s): Alternate breast massage  Type of Nipple: Everted at rest and after stimulation Intervention(s): Double electric pump  Comfort (Breast/Nipple): Soft / non-tender     Hold (Positioning): Assistance needed to correctly position infant at  breast and maintain latch. Intervention(s): Breastfeeding basics reviewed;Support Pillows;Position options;Skin to skin  LATCH Score: 7  Lactation Tools Discussed/Used Tools: Nipple Shields Nipple shield size: 24 Breast pump type: Double-Electric Breast Pump   Consult Status Consult Status: Follow-up Date: 09/10/15 Follow-up type: In-patient    Beverely RisenShoptaw, Arvella MerlesJana Lynn 09/09/2015, 7:48 PM

## 2015-09-09 NOTE — Lactation Note (Signed)
This note was copied from a baby's chart. Lactation Consultation Note  Patient Name: Nancy Hanna Billsmanda Stohr WUJWJ'XToday's Date: 09/09/2015 Reason for consult: Follow-up assessment Baby  24 hours hours at the start of the consult.  Baby easily awaked and several attempts at breast with the [facility] without a NS and with . Baby seemed  To stayed latched  Longer with the NS #24.  LC assessed tongue mobility with gloved finger and noted oral variance - tongue mobility - short anterior frenulum and doesn't not extend Tongue over gumline, also a high palate. Mom has semi compressible areolas , short shaft nipples, so moms tissue doesn't greet the baby's palate Well and that is why the baby stays latched with the NS better compared to skin to skin.  LC set up the DEBP and had mom pump both breast with #24 flange. Mom comfortable with flange and 2 ml EBM yield obtained - LC fed abby with a spoon 2ml  And baby tolerated well . After appetizer tried re-latching with NS and baby took a few sucks and was sleepy.  LC mentioned to mom since the baby is getting past 24 hours that the calories need are more. This  Afternoon can pump several times and hand express to increase volume.  Probably will need to give formula until volume increases, also hand express.  Debbora PrestoPatti Seibe MBU RN aware of LC plan .    Maternal Data Has patient been taught Hand Expression?: Yes  Feeding Feeding Type: Breast Fed Length of feed: 5 min  LATCH Score/Interventions Latch: Repeated attempts needed to sustain latch, nipple held in mouth throughout feeding, stimulation needed to elicit sucking reflex. Intervention(s): Adjust position;Assist with latch;Breast massage;Breast compression  Audible Swallowing: None  Type of Nipple: Everted at rest and after stimulation (with NS )  Comfort (Breast/Nipple): Soft / non-tender     Hold (Positioning): Assistance needed to correctly position infant at breast and maintain latch. Intervention(s):  Breastfeeding basics reviewed;Support Pillows;Position options;Skin to skin  LATCH Score: 6  Lactation Tools Discussed/Used Tools: Nipple Shields Nipple shield size: 24 Shell Type: Inverted Breast pump type: Double-Electric Breast Pump   Consult Status Consult Status: Follow-up Date: 09/09/15 Follow-up type: In-patient    Kathrin Greathouseorio, Annice Jolly Ann 09/09/2015, 1:04 PM

## 2015-09-10 NOTE — Lactation Note (Signed)
This note was copied from a baby's chart. Lactation Consultation Note; Parents have just finished feeding baby and siblings in to visit baby. Baby under phototherapy.Supplementing with EBM and formula with curved tip syringe at the breast.  Encouraged to page for assist at next feeding.   Patient Name: Nancy Hanna Nancy Stanke UJWJX'BToday's Date: 09/10/2015 Reason for consult: Follow-up assessment   Maternal Data Formula Feeding for Exclusion: No Does the patient have breastfeeding experience prior to this delivery?: Yes  Feeding   LATCH Score/Interventions Latch: Repeated attempts needed to sustain latch, nipple held in mouth throughout feeding, stimulation needed to elicit sucking reflex. Intervention(s): Skin to skin Intervention(s): Adjust position;Assist with latch;Breast massage;Breast compression  Audible Swallowing: A few with stimulation Intervention(s): Skin to skin  Type of Nipple: Flat Intervention(s): Reverse pressure;Shells;Double electric pump  Comfort (Breast/Nipple): Soft / non-tender     Hold (Positioning): Assistance needed to correctly position infant at breast and maintain latch.  LATCH Score: 6  Lactation Tools Discussed/Used Tools: Nipple Shields Nipple shield size: 20 Breast pump type: Double-Electric Breast Pump   Consult Status Consult Status: Follow-up Date: 09/10/15 Follow-up type: In-patient    Pamelia HoitWeeks, Chyann Ambrocio D 09/10/2015, 2:04 PM

## 2015-09-10 NOTE — Lactation Note (Addendum)
This note was copied from a baby's chart. Lactation Consultation Note Baby had been supplementing w/Alimentum in NS w/curve tip syring. Changed to Similac 22 cal. Since baby dropped to 5.15 lbs. Feeding well. Had 6 stools and 3 voids in 38 hrs.  Patient Name: Boy Dawson Billsmanda Rathbone ZOXWR'UToday's Date: 09/10/2015 Reason for consult: Follow-up assessment;Infant weight loss   Maternal Data    Feeding Feeding Type: Breast Milk with Formula added Length of feed: 10 min  LATCH Score/Interventions Latch: Repeated attempts needed to sustain latch, nipple held in mouth throughout feeding, stimulation needed to elicit sucking reflex. Intervention(s): Adjust position;Assist with latch  Audible Swallowing: Spontaneous and intermittent (swallows with formula in syringe)  Type of Nipple: Flat Intervention(s): Double electric pump (nipple shield)  Comfort (Breast/Nipple): Soft / non-tender     Hold (Positioning): Assistance needed to correctly position infant at breast and maintain latch. Intervention(s): Breastfeeding basics reviewed;Support Pillows;Position options;Skin to skin  LATCH Score: 7  Lactation Tools Discussed/Used Tools: Nipple Dorris CarnesShields   Consult Status Consult Status: Follow-up Date: 09/10/15 Follow-up type: In-patient    Evaline Waltman, Diamond NickelLAURA G 09/10/2015, 2:51 AM

## 2015-09-10 NOTE — Progress Notes (Signed)
Patient ID: Nancy Hanna, female   DOB: May 23, 1975, 40 y.o.   MRN: [id number] POD # 2  Subjective: Pt reports feeling well / Pain controlled with ibuprofen Tolerating po/Voiding without problems/ No n/v/Flatus pos Activity: out of bed and ambulate Bleeding is light Newborn info:  Information for the patient's newborn:  Nancy Hanna, Nancy Hanna [[id number]][[id number]]  female  / circ done per Dr Nancy Coastaavon / Feeding: breast Infant with hyper bili and on phototherapy   Objective: VS: BP 131/63 mmHg  Pulse 68  Temp(Src) 97.7 F (36.5 C) (Oral)  Resp 18  SpO2 98%  LABS:  Recent Labs  09/09/15 0604  WBC 7.8  HGB 9.2*  PLT 151     Physical Exam:  General: alert, cooperative and no distress CV: Regular rate and rhythm Resp: clear Abdomen: soft, nontender, normal bowel sounds Uterine Fundus: firm, below umbilicus, nontender Incision: Covered with Tegaderm and honeycomb dressing; well approximated. Lochia: minimal Ext: edema trace and Homans sign is negative, no sign of DVT    A/P: POD # 2 [id number]/ S/P C/Section d/t elective repeat Doing well Continue routine post op orders Anticipate discharge home in the am   Signed: Demetrius Hanna,Nancy Fetherolf K, MSN, [facility] IncWHNP 09/10/2015, 10:06 AM

## 2015-09-10 NOTE — Lactation Note (Signed)
This note was copied from a baby's chart. Lactation Consultation Note  Patient Name: Nancy Hanna ZOXWR'UToday's Date: 09/10/2015 Reason for consult: Follow-up assessment Baby at 54 hr of life and mom requested help with SNS. They have been offering formula with a curved tip syring at the breast but are switching to a SNS so that mom can do feedings without help. Coached FOB through set up of device. Instructed mom on placement of SNS with the NS. Baby was having a hard time getting a deep latch with the NS, he was under mom's breast almost laying flat on his back. Had mom bring him up so he was more on his side and he did much better. He goes to breast well with lots of sucking bursts but after 4-5 minutes he slows then tries to stop around 8 minutes. Parents do a great job of keeping him going for about 10 minutes. Walked FOB through the cleaning and storage of the SNS. They stated they were confident they can duplicate feeding. Encouraged mom to use the DEBP and offer her milk through the SNS as well as the formula in the amounts per guidelines. Mom will offer the breast on demand 8+/24hr with the SNS. She is aware of lactation services and support group.     Maternal Data Formula Feeding for Exclusion: No Does the patient have breastfeeding experience prior to this delivery?: Yes  Feeding Feeding Type: Breast Fed Length of feed: 15 min  LATCH Score/Interventions Latch: Repeated attempts needed to sustain latch, nipple held in mouth throughout feeding, stimulation needed to elicit sucking reflex. Intervention(s): Skin to skin Intervention(s): Adjust position;Assist with latch  Audible Swallowing: Spontaneous and intermittent Intervention(s): Hand expression Intervention(s): Alternate breast massage  Type of Nipple: Flat Intervention(s): Shells  Comfort (Breast/Nipple): Soft / non-tender     Hold (Positioning): Assistance needed to correctly position infant at breast and maintain  latch. Intervention(s): Position options;Support Pillows  LATCH Score: 7  Lactation Tools Discussed/Used Tools: Nipple Dorris CarnesShields;Supplemental Nutrition System Nipple shield size: 20 Shell Type: Inverted Breast pump type: Double-Electric Breast Pump   Consult Status Consult Status: Follow-up Date: 09/11/15 Follow-up type: In-patient    Rulon Eisenmengerlizabeth E Rowan Pollman 09/10/2015, 4:56 PM

## 2015-09-11 MED ORDER — IBUPROFEN 600 MG PO TABS: 600.0000 mg | ORAL_TABLET | Freq: Four times a day (QID) | ORAL | Status: AC

## 2015-09-11 MED ORDER — OXYCODONE-ACETAMINOPHEN 5-325 MG PO TABS: 1.0000 | ORAL_TABLET | ORAL | Status: DC | PRN

## 2015-09-11 NOTE — Discharge Instructions (Signed)
Breast Pumping Tips °If you are breastfeeding, there may be times when you cannot feed your baby directly. Returning to work or going on a trip are common examples. Pumping allows you to store breast milk and feed it to your baby later.  °You may not get much milk when you first start to pump. Your breasts should start to make more after a few days. If you pump at the times you usually feed your baby, you may be able to keep making enough milk to feed your baby without also using formula. The more often you pump, the more milk you will produce.  °WHEN SHOULD I PUMP?  °· You can begin to pump soon after delivery. However, some experts recommend waiting about 4 weeks before giving your infant a bottle to make sure breastfeeding is going well.  °· If you plan to return to work, begin pumping a few weeks before. This will help you develop techniques that work best for you. It also lets you build up a supply of breast milk.   °· When you are with your infant, feed on demand and pump after each feeding.   °· When you are away from your infant for several hours, pump for about 15 minutes every 2-3 hours. Pump both breasts at the same time if you can.   °· If your infant has a formula feeding, make sure to pump around the same time.     °· If you drink any alcohol, wait 2 hours before pumping.   °HOW DO I PREPARE TO PUMP? °Your let-down reflex is the natural reaction to stimulation that makes your breast milk flow. It is easier to stimulate this reflex when you are relaxed. Find relaxation techniques that work for you. If you have difficulty with your let-down reflex, try these methods:  °· Smell one of your infant's blankets or an item of clothing.   °· Look at a picture or video of your infant.   °· Sit in a quiet, private space.   °· Massage the breast you plan to pump.   °· Place soothing warmth on the breast.   °· Play relaxing music.   °WHAT ARE SOME GENERAL BREAST PUMPING TIPS? °· Wash your hands before you pump. You  do not need to wash your nipples or breasts. °· There are three ways to pump. °· You can use your hand to massage and compress your breast. °· You can use a handheld manual pump. °· You can use an electric pump.   °· Make sure the suction cup (flange) on the breast pump is the right size. Place the flange directly over the nipple. If it is the wrong size or placed the wrong way, it may be painful and cause nipple damage.   °· If pumping is uncomfortable, apply a small amount of purified or modified lanolin to your nipple and areola. °· If you are using an electric pump, adjust the speed and suction power to be more comfortable. °· If pumping is painful or if you are not getting very much milk, you may need a different type of pump. A lactation consultant can help you determine what type of pump to use.   °· Keep a full water bottle near you at all times. Drinking lots of fluid helps you make more milk.  °· You can store your milk to use later. Pumped breast milk can be stored in a sealable, sterile container or plastic bag. Label all stored breast milk with the date you pumped it. °· Milk can stay out at room temperature for up to 8 hours. °·   You can store your milk in the refrigerator for up to 8 days. °· You can store your milk in the freezer for 3 months. Thaw frozen milk using warm water. Do not put it in the microwave. °· Do not smoke. Smoking can lower your milk supply and harm your infant. If you need help quitting, ask your health care provider to recommend a program.   °WHEN SHOULD I CALL MY HEALTH CARE PROVIDER OR A LACTATION CONSULTANT? °· You are having trouble pumping. °· You are concerned that you are not making enough milk. °· You have nipple pain, soreness, or redness. °· You want to use birth control. Birth control pills may lower your milk supply. Talk to your health care provider about your options. °  °This information is not intended to replace advice given to you by your health care provider.  Make sure you discuss any questions you have with your health care provider. °  °Document Released: 10/14/2009 Document Revised: 05/01/2013 Document Reviewed: 02/16/2013 °[employer]vier Interactive Patient Education ©2016 [employer] °Postpartum Depression and Baby Blues °The postpartum period begins right after the birth of a baby. During this time, there is often a great amount of joy and excitement. It is also a time of many changes in the life of the parents. Regardless of how many times a mother gives birth, each child brings new challenges and dynamics to the family. It is not unusual to have feelings of excitement along with confusing shifts in moods, emotions, and thoughts. All mothers are at risk of developing postpartum depression or the "baby blues." These mood changes can occur right after giving birth, or they may occur many months after giving birth. The baby blues or postpartum depression can be mild or severe. Additionally, postpartum depression can go away rather quickly, or it can be a long-term condition.  °CAUSES °Raised hormone levels and the rapid drop in those levels are thought to be a main cause of postpartum depression and the baby blues. A number of hormones change during and after pregnancy. Estrogen and progesterone usually decrease right after the delivery of your baby. The levels of thyroid hormone and various cortisol steroids also rapidly drop. Other factors that play a role in these mood changes include major life events and genetics.  °RISK FACTORS °If you have any of the following risks for the baby blues or postpartum depression, know what symptoms to watch out for during the postpartum period. Risk factors that may increase the likelihood of getting the baby blues or postpartum depression include: °· Having a personal or family history of depression.   °· Having depression while being pregnant.   °· Having premenstrual mood issues or mood issues related to oral  contraceptives. °· Having a lot of life stress.   °· Having marital conflict.   °· Lacking a social support network.   °· Having a baby with special needs.   °· Having health problems, such as diabetes.   °SIGNS AND SYMPTOMS °Symptoms of baby blues include: °· Brief changes in mood, such as going from extreme happiness to sadness. °· Decreased concentration.   °· Difficulty sleeping.   °· Crying spells, tearfulness.   °· Irritability.   °· Anxiety.   °Symptoms of postpartum depression typically begin within the first month after giving birth. These symptoms include: °· Difficulty sleeping or excessive sleepiness.   °· Marked weight loss.   °· Agitation.   °· Feelings of worthlessness.   °· Lack of interest in activity or food.   °Postpartum psychosis is a very serious condition and can be dangerous. Fortunately, it is   rare. Displaying any of the following symptoms is cause for immediate medical attention. Symptoms of postpartum psychosis include:  °· Hallucinations and delusions.   °· Bizarre or disorganized behavior.   °· Confusion or disorientation.   °DIAGNOSIS  °A diagnosis is made by an evaluation of your symptoms. There are no medical or lab tests that lead to a diagnosis, but there are various questionnaires that a health care provider may use to identify those with the baby blues, postpartum depression, or psychosis. Often, a screening tool called the Edinburgh Postnatal Depression Scale is used to diagnose depression in the postpartum period.  °TREATMENT °The baby blues usually goes away on its own in 1-2 weeks. Social support is often all that is needed. You will be encouraged to get adequate sleep and rest. Occasionally, you may be given medicines to help you sleep.  °Postpartum depression requires treatment because it can last several months or longer if it is not treated. Treatment may include individual or group therapy, medicine, or both to address any social, physiological, and psychological factors  that may play a role in the depression. Regular exercise, a healthy diet, rest, and social support may also be strongly recommended.  °Postpartum psychosis is more serious and needs treatment right away. Hospitalization is often needed. °HOME CARE INSTRUCTIONS °· Get as much rest as you can. Nap when the baby sleeps.   °· Exercise regularly. Some women find yoga and walking to be beneficial.   °· Eat a balanced and nourishing diet.   °· Do little things that you enjoy. Have a cup of tea, take a bubble bath, read your favorite magazine, or listen to your favorite music. °· Avoid alcohol.   °· Ask for help with household chores, cooking, grocery shopping, or running errands as needed. Do not try to do everything.   °· Talk to people close to you about how you are feeling. Get support from your partner, family members, friends, or other new moms. °· Try to stay positive in how you think. Think about the things you are grateful for.   °· Do not spend a lot of time alone.   °· Only take over-the-counter or prescription medicine as directed by your health care provider. °· Keep all your postpartum appointments.   °· Let your health care provider know if you have any concerns.   °SEEK MEDICAL CARE IF: °You are having a reaction to or problems with your medicine. °SEEK IMMEDIATE MEDICAL CARE IF: °· You have suicidal feelings.   °· You think you may harm the baby or someone else. °MAKE SURE YOU: °· Understand these instructions. °· Will watch your condition. °· Will get help right away if you are not doing well or get worse. °  °This information is not intended to replace advice given to you by your health care provider. Make sure you discuss any questions you have with your health care provider. °  °Document Released: 01/29/2004 Document Revised: 05/01/2013 Document Reviewed: 02/05/2013 °Elsevier Interactive Patient Education ©2016 [employer] °Postpartum Care After Cesarean Delivery °After you deliver your newborn  (postpartum period), the usual stay in the hospital is 24-72 hours. If there were problems with your labor or delivery, or if you have other medical problems, you might be in the hospital longer.  °While you are in the hospital, you will receive help and instructions on how to care for yourself and your newborn during the postpartum period.  °While you are in the hospital: °· It is normal for you to have pain or discomfort from the incision in your   abdomen. Be sure to tell your nurses when you are having pain, where the pain is located, and what makes the pain worse. °· If you are breastfeeding, you may feel uncomfortable contractions of your uterus for a couple of weeks. This is normal. The contractions help your uterus get back to normal size. °· It is normal to have some bleeding after delivery. °· For the first 1-3 days after delivery, the flow is red and the amount may be similar to a period. °· It is common for the flow to start and stop. °· In the first few days, you may pass some small clots. Let your nurses know if you begin to pass large clots or your flow increases. °· Do not  flush blood clots down the toilet before having the nurse look at them. °· During the next 3-10 days after delivery, your flow should become more watery and pink or brown-tinged in color. °· Ten to fourteen days after delivery, your flow should be a small amount of yellowish-white discharge. °· The amount of your flow will decrease over the first few weeks after delivery. Your flow may stop in 6-8 weeks. Most women have had their flow stop by 12 weeks after delivery. °· You should change your sanitary pads frequently. °· Wash your hands thoroughly with soap and water for at least 20 seconds after changing pads, using the toilet, or before holding or feeding your newborn. °· Your intravenous (IV) tubing will be removed when you are drinking enough fluids. °· The urine drainage tube (urinary catheter) that was inserted before delivery  may be removed within 6-8 hours after delivery or when feeling returns to your legs. You should feel like you need to empty your bladder within the first 6-8 hours after the catheter has been removed. °· In case you become weak, lightheaded, or faint, call your nurse before you get out of bed for the first time and before you take a shower for the first time. °· Within the first few days after delivery, your breasts may begin to feel tender and full. This is called engorgement. Breast tenderness usually goes away within 48-72 hours after engorgement occurs. You may also notice milk leaking from your breasts. If you are not breastfeeding, do not stimulate your breasts. Breast stimulation can make your breasts produce more milk. °· Spending as much time as possible with your newborn is very important. During this time, you and your newborn can feel close and get to know each other. Having your newborn stay in your room (rooming in) will help to strengthen the bond with your newborn. It will give you time to get to know your newborn and become comfortable caring for your newborn. °· Your hormones change after delivery. Sometimes the hormone changes can temporarily cause you to feel sad or tearful. These feelings should not last more than a few days. If these feelings last longer than that, you should talk to your caregiver. °· If desired, talk to your caregiver about methods of family planning or contraception. °· Talk to your caregiver about immunizations. Your caregiver may want you to have the following immunizations before leaving the hospital: °· Tetanus, diphtheria, and pertussis (Tdap) or tetanus and diphtheria (Td) immunization. It is very important that you and your family (including grandparents) or others caring for your newborn are up-to-date with the Tdap or Td immunizations. The Tdap or Td immunization can help protect your newborn from getting ill. °· Rubella immunization. °· Varicella (chickenpox)    immunization. °· Influenza immunization. You should receive this annual immunization if you did not receive the immunization during your pregnancy. °  °This information is not intended to replace advice given to you by your health care provider. Make sure you discuss any questions you have with your health care provider. °  °Document Released: 01/19/2012 Document Reviewed: 01/19/2012 °[employer]vier Interactive Patient Education ©2016 [employer] °Breastfeeding and Mastitis °Mastitis is inflammation of the breast tissue. It can occur in women who are breastfeeding. This can make breastfeeding painful. Mastitis will sometimes go away on its own. Your health care provider will help determine if treatment is needed. °CAUSES °Mastitis is often associated with a blocked milk (lactiferous) duct. This can happen when too much milk builds up in the breast. Causes of excess milk in the breast can include: °· Poor latch-on. If your baby is not latched onto the breast properly, she or he may not empty your breast completely while breastfeeding. °· Allowing too much time to pass between feedings. °· Wearing a bra or other clothing that is too tight. This puts extra pressure on the lactiferous ducts so milk does not flow through them as it should. °Mastitis can also be caused by a bacterial infection. Bacteria may enter the breast tissue through cuts or openings in the skin. In women who are breastfeeding, this may occur because of cracked or irritated skin. Cracks in the skin are often caused when your baby does not latch on properly to the breast. °SIGNS AND SYMPTOMS °· Swelling, redness, tenderness, and pain in an area of the breast. °· Swelling of the glands under the arm on the same side. °· Fever may or may not accompany mastitis. °If an infection is allowed to progress, a collection of pus (abscess) may develop. °DIAGNOSIS  °Your health care provider can usually diagnose mastitis based on your symptoms and a physical exam.  Tests may be done to help confirm the diagnosis. These may include: °· Removal of pus from the breast by applying pressure to the area. This pus can be examined in the lab to determine which bacteria are present. If an abscess has developed, the fluid in the abscess can be removed with a needle. This can also be used to confirm the diagnosis and determine the bacteria present. In most cases, pus will not be present. °· Blood tests to determine if your body is fighting a bacterial infection. °· Mammogram or ultrasound tests to rule out other problems or diseases. °TREATMENT  °Mastitis that occurs with breastfeeding will sometimes go away on its own. Your health care provider may choose to wait 24 hours after first seeing you to decide whether a prescription medicine is needed. If your symptoms are worse after 24 hours, your health care provider will likely prescribe an antibiotic medicine to treat the mastitis. He or she will determine which bacteria are most likely causing the infection and will then select an appropriate antibiotic medicine. This is sometimes changed based on the results of tests performed to identify the bacteria, or if there is no response to the antibiotic medicine selected. Antibiotic medicines are usually given by mouth. You may also be given medicine for pain. °HOME CARE INSTRUCTIONS °· Only take over-the-counter or prescription medicines for pain, fever, or discomfort as directed by your health care provider. °· If your health care provider prescribed an antibiotic medicine, take the medicine as directed. Make sure you finish it even if you start to feel better. °· Do not wear a   tight or underwire bra. Wear a soft, supportive bra. °· Increase your fluid intake, especially if you have a fever. °· Continue to empty the breast. Your health care provider can tell you whether this milk is safe for your infant or needs to be thrown out. You may be told to stop nursing until your health care  provider thinks it is safe for your baby. Use a breast pump if you are advised to stop nursing. °· Keep your nipples clean and dry. °· Empty the first breast completely before going to the other breast. If your baby is not emptying your breasts completely for some reason, use a breast pump to empty your breasts. °· If you go back to work, pump your breasts while at work to stay in time with your nursing schedule. °· Avoid allowing your breasts to become overly filled with milk (engorged). °SEEK MEDICAL CARE IF: °· You have pus-like discharge from the breast. °· Your symptoms do not improve with the treatment prescribed by your health care provider within 2 days. °SEEK IMMEDIATE MEDICAL CARE IF: °· Your pain and swelling are getting worse. °· You have pain that is not controlled with medicine. °· You have a red line extending from the breast toward your armpit. °· You have a fever or persistent symptoms for more than 2-3 days. °· You have a fever and your symptoms suddenly get worse. °MAKE SURE YOU:  °· Understand these instructions. °· Will watch your condition. °· Will get help right away if you are not doing well or get worse. °  °This information is not intended to replace advice given to you by your health care provider. Make sure you discuss any questions you have with your health care provider. °  °Document Released: 08/21/2004 Document Revised: 05/01/2013 Document Reviewed: 11/30/2012 °[employer]vier Interactive Patient Education ©2016 [employer] °Breastfeeding °Deciding to breastfeed is one of the best choices you can make for you and your baby. A change in hormones during pregnancy causes your breast tissue to grow and increases the number and size of your milk ducts. These hormones also allow proteins, sugars, and fats from your blood supply to make breast milk in your milk-producing glands. Hormones prevent breast milk from being released before your baby is born as well as prompt milk flow after birth. Once  breastfeeding has begun, thoughts of your baby, as well as his or her sucking or crying, can stimulate the release of milk from your milk-producing glands.  °BENEFITS OF BREASTFEEDING °For Your Baby °· Your first milk (colostrum) helps your baby's digestive system function better. °· There are antibodies in your milk that help your baby fight off infections. °· Your baby has a lower incidence of asthma, allergies, and sudden infant death syndrome. °· The nutrients in breast milk are better for your baby than infant formulas and are designed uniquely for your baby's needs. °· Breast milk improves your baby's brain development. °· Your baby is less likely to develop other conditions, such as childhood obesity, asthma, or type 2 diabetes mellitus. °For You °· Breastfeeding helps to create a very special bond between you and your baby. °· Breastfeeding is convenient. Breast milk is always available at the correct temperature and costs nothing. °· Breastfeeding helps to burn calories and helps you lose the weight gained during pregnancy. °· Breastfeeding makes your uterus contract to its prepregnancy size faster and slows bleeding (lochia) after you give birth.   °· Breastfeeding helps to lower your risk of developing type   2 diabetes mellitus, osteoporosis, and breast or ovarian cancer later in life. °SIGNS THAT YOUR BABY IS HUNGRY °Early Signs of Hunger °· Increased alertness or activity. °· Stretching. °· Movement of the head from side to side. °· Movement of the head and opening of the mouth when the corner of the mouth or cheek is stroked (rooting). °· Increased sucking sounds, smacking lips, cooing, sighing, or squeaking. °· Hand-to-mouth movements. °· Increased sucking of fingers or hands. °Late Signs of Hunger °· Fussing. °· Intermittent crying. °Extreme Signs of Hunger °Signs of extreme hunger will require calming and consoling before your baby will be able to breastfeed successfully. Do not wait for the  following signs of extreme hunger to occur before you initiate breastfeeding: °· Restlessness. °· A loud, strong cry. °· Screaming. °BREASTFEEDING BASICS °Breastfeeding Initiation °· Find a comfortable place to sit or lie down, with your neck and back well supported. °· Place a pillow or rolled up blanket under your baby to bring him or her to the level of your breast (if you are seated). Nursing pillows are specially designed to help support your arms and your baby while you breastfeed. °· Make sure that your baby's abdomen is facing your abdomen. °· Gently massage your breast. With your fingertips, massage from your chest wall toward your nipple in a circular motion. This encourages milk flow. You may need to continue this action during the feeding if your milk flows slowly. °· Support your breast with 4 fingers underneath and your thumb above your nipple. Make sure your fingers are well away from your nipple and your baby's mouth. °· Stroke your baby's lips gently with your finger or nipple. °· When your baby's mouth is open wide enough, quickly bring your baby to your breast, placing your entire nipple and as much of the colored area around your nipple (areola) as possible into your baby's mouth. °· More areola should be visible above your baby's upper lip than below the lower lip. °· Your baby's tongue should be between his or her lower gum and your breast. °· Ensure that your baby's mouth is correctly positioned around your nipple (latched). Your baby's lips should create a seal on your breast and be turned out (everted). °· It is common for your baby to suck about 2-3 minutes in order to start the flow of breast milk. °Latching °Teaching your baby how to latch on to your breast properly is very important. An improper latch can cause nipple pain and decreased milk supply for you and poor weight gain in your baby. Also, if your baby is not latched onto your nipple properly, he or she may swallow some air during  feeding. This can make your baby fussy. Burping your baby when you switch breasts during the feeding can help to get rid of the air. However, teaching your baby to latch on properly is still the best way to prevent fussiness from swallowing air while breastfeeding. °Signs that your baby has successfully latched on to your nipple: °· Silent tugging or silent sucking, without causing you pain. °· Swallowing heard between every 3-4 sucks. °· Muscle movement above and in front of his or her ears while sucking. °Signs that your baby has not successfully latched on to nipple: °· Sucking sounds or smacking sounds from your baby while breastfeeding. °· Nipple pain. °If you think your baby has not latched on correctly, slip your finger into the corner of your baby's mouth to break the suction and place it   between your baby's gums. Attempt breastfeeding initiation again. °Signs of Successful Breastfeeding °Signs from your baby: °· A gradual decrease in the number of sucks or complete cessation of sucking. °· Falling asleep. °· Relaxation of his or her body. °· Retention of a small amount of milk in his or her mouth. °· Letting go of your breast by himself or herself. °Signs from you: °· Breasts that have increased in firmness, weight, and size 1-3 hours after feeding. °· Breasts that are softer immediately after breastfeeding. °· Increased milk volume, as well as a change in milk consistency and color by the fifth day of breastfeeding. °· Nipples that are not sore, cracked, or bleeding. °Signs That Your Baby is Getting Enough Milk °· Wetting at least 3 diapers in a 24-hour period. The urine should be clear and pale yellow by age 5 days. °· At least 3 stools in a 24-hour period by age 5 days. The stool should be soft and yellow. °· At least 3 stools in a 24-hour period by age 7 days. The stool should be seedy and yellow. °· No loss of weight greater than 10% of birth weight during the first 3 days of age. °· Average weight  gain of 4-7 ounces (113-198 g) per week after age 4 days. °· Consistent daily weight gain by age 5 days, without weight loss after the age of 2 weeks. °After a feeding, your baby may spit up a small amount. This is common. °BREASTFEEDING FREQUENCY AND DURATION °Frequent feeding will help you make more milk and can prevent sore nipples and breast engorgement. Breastfeed when you feel the need to reduce the fullness of your breasts or when your baby shows signs of hunger. This is called "breastfeeding on demand." Avoid introducing a pacifier to your baby while you are working to establish breastfeeding (the first 4-6 weeks after your baby is born). After this time you may choose to use a pacifier. Research has shown that pacifier use during the first year of a baby's life decreases the risk of sudden infant death syndrome (SIDS). °Allow your baby to feed on each breast as long as he or she wants. Breastfeed until your baby is finished feeding. When your baby unlatches or falls asleep while feeding from the first breast, offer the second breast. Because newborns are often sleepy in the first few weeks of life, you may need to awaken your baby to get him or her to feed. °Breastfeeding times will vary from baby to baby. However, the following rules can serve as a guide to help you ensure that your baby is properly fed: °· Newborns (babies 4 weeks of age or younger) may breastfeed every 1-3 hours. °· Newborns should not go longer than 3 hours during the day or 5 hours during the night without breastfeeding. °· You should breastfeed your baby a minimum of 8 times in a 24-hour period until you begin to introduce solid foods to your baby at around 6 months of age. °BREAST MILK PUMPING °Pumping and storing breast milk allows you to ensure that your baby is exclusively fed your breast milk, even at times when you are unable to breastfeed. This is especially important if you are going back to work while you are still  breastfeeding or when you are not able to be present during feedings. Your lactation consultant can give you guidelines on how long it is safe to store breast milk. °A breast pump is a machine that allows you to pump milk   from your breast into a sterile bottle. The pumped breast milk can then be stored in a refrigerator or freezer. Some breast pumps are operated by hand, while others use electricity. Ask your lactation consultant which type will work best for you. Breast pumps can be purchased, but some hospitals and breastfeeding support groups lease breast pumps on a monthly basis. A lactation consultant can teach you how to hand express breast milk, if you prefer not to use a pump. °CARING FOR YOUR BREASTS WHILE YOU BREASTFEED °Nipples can become dry, cracked, and sore while breastfeeding. The following recommendations can help keep your breasts moisturized and healthy: °· Avoid using soap on your nipples. °· Wear a supportive bra. Although not required, special nursing bras and tank tops are designed to allow access to your breasts for breastfeeding without taking off your entire bra or top. Avoid wearing underwire-style bras or extremely tight bras. °· Air dry your nipples for 3-4 minutes after each feeding. °· Use only cotton bra pads to absorb leaked breast milk. Leaking of breast milk between feedings is normal. °· Use lanolin on your nipples after breastfeeding. Lanolin helps to maintain your skin's normal moisture barrier. If you use pure lanolin, you do not need to wash it off before feeding your baby again. Pure lanolin is not toxic to your baby. You may also hand express a few drops of breast milk and gently massage that milk into your nipples and allow the milk to air dry. °In the first few weeks after giving birth, some women experience extremely full breasts (engorgement). Engorgement can make your breasts feel heavy, warm, and tender to the touch. Engorgement peaks within 3-5 days after you give  birth. The following recommendations can help ease engorgement: °· Completely empty your breasts while breastfeeding or pumping. You may want to start by applying warm, moist heat (in the shower or with warm water-soaked hand towels) just before feeding or pumping. This increases circulation and helps the milk flow. If your baby does not completely empty your breasts while breastfeeding, pump any extra milk after he or she is finished. °· Wear a snug bra (nursing or regular) or tank top for 1-2 days to signal your body to slightly decrease milk production. °· Apply ice packs to your breasts, unless this is too uncomfortable for you. °· Make sure that your baby is latched on and positioned properly while breastfeeding. °If engorgement persists after 48 hours of following these recommendations, contact your health care provider or a lactation consultant. °OVERALL HEALTH CARE RECOMMENDATIONS WHILE BREASTFEEDING °· Eat healthy foods. Alternate between meals and snacks, eating 3 of each per day. Because what you eat affects your breast milk, some of the foods may make your baby more irritable than usual. Avoid eating these foods if you are sure that they are negatively affecting your baby. °· Drink milk, fruit juice, and water to satisfy your thirst (about 10 glasses a day). °· Rest often, relax, and continue to take your prenatal vitamins to prevent fatigue, stress, and anemia. °· Continue breast self-awareness checks. °· Avoid chewing and smoking tobacco. Chemicals from cigarettes that pass into breast milk and exposure to secondhand smoke may harm your baby. °· Avoid alcohol and drug use, including marijuana. °Some medicines that may be harmful to your baby can pass through breast milk. It is important to ask your health care provider before taking any medicine, including all over-the-counter and prescription medicine as well as vitamin and herbal supplements. °It is possible to become   pregnant while breastfeeding. If  birth control is desired, ask your health care provider about options that will be safe for your baby. °SEEK MEDICAL CARE IF: °· You feel like you want to stop breastfeeding or have become frustrated with breastfeeding. °· You have painful breasts or nipples. °· Your nipples are cracked or bleeding. °· Your breasts are red, tender, or warm. °· You have a swollen area on either breast. °· You have a fever or chills. °· You have nausea or vomiting. °· You have drainage other than breast milk from your nipples. °· Your breasts do not become full before feedings by the fifth day after you give birth. °· You feel sad and depressed. °· Your baby is too sleepy to eat well. °· Your baby is having trouble sleeping.   °· Your baby is wetting less than 3 diapers in a 24-hour period. °· Your baby has less than 3 stools in a 24-hour period. °· Your baby's skin or the white part of his or her eyes becomes yellow.   °· Your baby is not gaining weight by 5 days of age. °SEEK IMMEDIATE MEDICAL CARE IF: °· Your baby is overly tired (lethargic) and does not want to wake up and feed. °· Your baby develops an unexplained fever. °  °This information is not intended to replace advice given to you by your health care provider. Make sure you discuss any questions you have with your health care provider. °  °Document Released: 04/26/2005 Document Revised: 01/15/2015 Document Reviewed: 10/18/2012 °Elsevier Interactive Patient Education ©2016 [employer] ° °

## 2015-09-11 NOTE — Discharge Summary (Signed)
OB Discharge Summary     Patient Name: Nancy Hanna DOB: 01/11/1976 MRN: [id number]  Date of admission: 09/08/2015 Delivering MD: Olivia Mackie   Date of discharge: 09/11/2015  Admitting diagnosis: Previous Cesarean Section x 2 Intrauterine pregnancy: [redacted]w[redacted]d     Secondary diagnosis:  Principal Problem:   Postpartum care following repeat cesarean delivery (5/1) Active Problems:   Previous cesarean section  Additional problems: none     Discharge diagnosis: Term Pregnancy Delivered                                                                                                Post partum procedures:none  Augmentation: none  Complications: None  Hospital course:  Sceduled C/S   40 y.o. yo G4P2003 at [redacted]w[redacted]d was admitted to the hospital 09/08/2015 for scheduled cesarean section with the following indication:Elective Repeat.  Membrane Rupture Time/Date: 10:01 AM ,09/08/2015   Patient delivered a Viable infant.09/08/2015  Details of operation can be found in separate operative note.  Pateint had an uncomplicated postpartum course.  She is ambulating, tolerating a regular diet, passing flatus, and urinating well. Patient is discharged home in stable condition on  09/11/2015          Physical exam  Filed Vitals:   09/09/15 2135 09/10/15 0615 09/10/15 2240 09/11/15 0500  BP: 118/63 131/63 129/74 131/74  Pulse: 83 68 80 68  Temp: 98.2 F (36.8 C) 97.7 F (36.5 C) 98 F (36.7 C) 98.6 F (37 C)  TempSrc: Oral Oral Oral Oral  Resp: SpO2:       General: alert, cooperative and no distress Lochia: appropriate Uterine Fundus: firm, midline, U-2 Incision: Healing well with no significant drainage, No significant erythema, Dressing is clean, dry, and intact, skin well-approximated with sutures DVT Evaluation: No evidence of DVT seen on physical exam. Negative Homan's sign. No cords or calf tenderness. Trace calf/Ankle edema is present Labs: Lab Results  Component  Value Date   WBC 7.8 09/09/2015   HGB 9.2* 09/09/2015   HCT 27.4* 09/09/2015   MCV 92.3 09/09/2015   PLT 151 09/09/2015   No flowsheet data found.  Discharge instruction: per After Visit Summary and "Baby and Me Booklet".  After visit meds:    Medication List    TAKE these medications        ibuprofen 600 MG tablet  Commonly known as:  ADVIL,MOTRIN  Take 1 tablet (600 mg total) by mouth every 6 (six) hours.     multivitamin-prenatal 27-0.8 MG Tabs tablet  Take 1 tablet by mouth daily at 12 noon.     oxyCODONE-acetaminophen 5-325 MG tablet  Commonly known as:  PERCOCET/ROXICET  Take 1 tablet by mouth every 4 (four) hours as needed (pain scale 4-7).        Diet: routine diet  Activity: Advance as tolerated. Pelvic rest for 6 weeks.   Outpatient follow up:6 weeks Follow up Appt:No future appointments. Follow up Visit:No Follow-up on file.  Postpartum contraception: Undecided  Newborn Data: Live born female on 09/08/2015 Birth Weight: 6 lb 7.7 oz (2940  g) APGAR: 8, 9  Baby Feeding: Breast Disposition:home with mother   09/11/2015 Raelyn MoraAWSON, Keanu Lesniak, Judie PetitM, CNM

## 2015-09-11 NOTE — Lactation Note (Signed)
This note was copied from a baby's chart. Lactation Consultation Note  Patient Name: Nancy Hanna: 09/11/2015 Reason for consult: Follow-up assessment;Hyperbilirubinemia;Infant < 6lbs Baby on double photo therapy but will now start single photo therapy. Baby will not be d/c today. Mom trying to latch baby in cradle hold but baby not able to sustain latch, changed to cross cradle but baby still not sustaining depth, dimpling at breast and fussy. Mom stopped using the nipple shield but LC encouraged to use nipple shield to help baby sustain depth. Mom's nipples have short shaft. #24 nipple shield applied and baby sustained the latch with nutritive suckling noted. Demonstrated how to use 5 fr feeding tube/syringe at breast to supplement. Baby took this well.  Encouraged parents to continue to BF with each feeding. Use 5 fr feeding tube/syringe at breast (parents did not like SNS) but if overwhelming then use bottle to supplement. Mom to supplement after each feeding due to 9% weight loss and hyperbilirubinemia,  Encouraged 20 ml with each feeding EBM/formula. Mom to post pump every 3 hours for 15 minutes to have EBM to supplement and encourage milk production.  Mom to use nipple shield to latch, some colostrum visible in nipple shield with this feeding and baby seemed satisfied after BF and supplement. Continue to monitor voids/stools.  Encouraged to call with next feeding.   Maternal Data    Feeding Feeding Type: Breast Milk Length of feed: 20 min  LATCH Score/Interventions Latch: Grasps breast easily, tongue down, lips flanged, rhythmical sucking. (using #24 nipple shield) Intervention(s): Assist with latch  Audible Swallowing: Spontaneous and intermittent (w/supplement at breast)  Type of Nipple: Everted at rest and after stimulation (short nipple shafts bilateral)  Comfort (Breast/Nipple): Soft / non-tender     Hold (Positioning): Assistance needed to correctly  position infant at breast and maintain latch. Intervention(s): Breastfeeding basics reviewed;Support Pillows;Position options;Skin to skin  LATCH Score: 9  Lactation Tools Discussed/Used Tools: Nipple Shields;Pump;69F feeding tube / Syringe;Supplemental Nutrition System Nipple shield size: 20;24 Breast pump type: Double-Electric Breast Pump   Consult Status Consult Status: Follow-up Hanna: 09/11/15 Follow-up type: In-patient    Nancy LevinsGranger, Nancy Hanna 09/11/2015, 10:57 AM

## 2015-09-11 NOTE — Progress Notes (Addendum)
Patient ID: Nancy Hanna, female   DOB: February 02, 1976, 40 y.o.   MRN: [id number] Subjective: S/P Repeat Cesarean Delivery  POD# 3 Information for the patient's newborn:  Nancy Hanna, Nancy Hanna [[id number]][[id number]]  female  / circ done  Reports feeling well. A little sore, but ready to go home. Feeding: breast Patient reports tolerating PO.  Breast symptoms: none - nursing is getting better Pain controlled with ibuprofen (OTC) and narcotic analgesics including Percocet Denies HA/SOB/C/P/N/V/dizziness. Flatus present. (+) BM. She reports vaginal bleeding as normal, without clots.  She is ambulating, urinating without difficult.     Objective:   VS:  Filed Vitals:   09/09/15 2135 09/10/15 0615 09/10/15 2240 09/11/15 0500  BP: 118/63 131/63 129/74 131/74  Pulse: 83 68 80 68  Temp: 98.2 F (36.8 C) 97.7 F (36.5 C) 98 F (36.7 C) 98.6 F (37 C)  TempSrc: Oral Oral Oral Oral  Resp: 20 18 18 18   SpO2:           Recent Labs  09/09/15 0604  WBC 7.8  HGB 9.2*  HCT 27.4*  PLT 151     Blood type: O POS (04/28 1055)  Rubella: Immune (10/06 0000)     Physical Exam:   General: alert, cooperative, fatigued and no distress  Abdomen: soft, nontender, normal bowel sounds  Incision: clean, dry, intact and skin well-approximated with sutures  Uterine Fundus: firm, 2 FB below umbilicus, nontender  Lochia: minimal  Ext: edema trace and Homans sign is negative, no sign of DVT   Assessment/Plan: 40 y.o.   POD# 3.  S/P Cesarean Delivery.  Indications: elective repeat                Principal Problem:   Postpartum care following repeat cesarean delivery (5/1) Active Problems:   Previous cesarean section  Doing well, stable.               Regular diet as tolerated Ambulate Routine post-op care D/C home today  Kenard GowerDAWSON, Yeiren Whitecotton, M, MSN, CNM 09/11/2015, 8:23 AM

## 2016-03-29 DIAGNOSIS — L719 Rosacea, unspecified: Secondary | ICD-10-CM | POA: Diagnosis not present

## 2016-05-20 ENCOUNTER — Encounter: Payer: Self-pay | Admitting: Family Medicine

## 2016-05-20 ENCOUNTER — Ambulatory Visit (INDEPENDENT_AMBULATORY_CARE_PROVIDER_SITE_OTHER): Payer: BLUE CROSS/BLUE SHIELD | Admitting: Family Medicine

## 2016-05-20 VITALS — BP 118/80 | HR 106 | Temp 98.4°F | Resp 16 | Wt 152.8 lb

## 2016-05-20 DIAGNOSIS — M5442 Lumbago with sciatica, left side: Secondary | ICD-10-CM

## 2016-05-20 MED ORDER — PREDNISONE 20 MG PO TABS: ORAL_TABLET | ORAL | 0 refills | Status: DC

## 2016-05-20 NOTE — Patient Instructions (Signed)
Stop ibuprofen or Aleve while on prednisone. May take tylenol up to 3000 mg./day

## 2016-05-20 NOTE — Progress Notes (Signed)
Subjective:     Patient ID: Nancy Hanna, female   DOB: 1976-01-19, 41 y.o.   MRN: [id number]  HPI  Chief Complaint  Patient presents with  . Back Pain    Patient comes in office today with complaints of lower back pain that began two weeks ago. Patient reports that she had been lifting and moving chairs and tables and as of recently has been working out on new exercise machine called the bowflex. Patient states that she has a history of back problems and she has seen chiropractor 5x since 05/12/16 for adjustment. Patient states pain is mostly on the left side radiating down leg and described as burning. Patient has been taking otc Ibuprofen for relief.    Has not used the machine for 1.5 weeks with little improvement. Has been taking 400 mg. Ibuprofen for at least a week. Accompanied by her mother today. No prior surgery but has has chiropractic treatment of prior back strains.   Review of Systems     Objective:   Physical Exam  Constitutional: She appears well-developed and well-nourished. She appears distressed (moderate from pain in low back).  Musculoskeletal:  Muscle strength in lower extremities 5/5. SLR's to 90 degrees without radiation of back pain. Localizes pain to left SI area. Mild tenderness there and in her ischial area.       Assessment:    1. Acute left-sided low back pain with left-sided sciatica - predniSONE (DELTASONE) 20 MG tablet; Taper as follows: 3 pills for 4 days, two pills for 4 days, one pill for four days  Dispense: 24 tablet; Refill: 0    Plan:    Discussed use of Tylenol and to avoid nsaid's while on prednisone. Consider imaging if not improving. Patient defers narcotic medication.

## 2016-05-31 DIAGNOSIS — L719 Rosacea, unspecified: Secondary | ICD-10-CM | POA: Diagnosis not present

## 2016-05-31 DIAGNOSIS — L720 Epidermal cyst: Secondary | ICD-10-CM | POA: Diagnosis not present

## 2016-05-31 DIAGNOSIS — D1720 Benign lipomatous neoplasm of skin and subcutaneous tissue of unspecified limb: Secondary | ICD-10-CM | POA: Diagnosis not present

## 2016-06-21 ENCOUNTER — Ambulatory Visit
Admission: RE | Admit: 2016-06-21 | Discharge: 2016-06-21 | Disposition: A | Discharge status: Home / Self Care | Payer: BLUE CROSS/BLUE SHIELD | Source: Ambulatory Visit | Attending: Family Medicine | Admitting: Family Medicine

## 2016-06-21 ENCOUNTER — Telehealth: Payer: Self-pay

## 2016-06-21 ENCOUNTER — Ambulatory Visit (INDEPENDENT_AMBULATORY_CARE_PROVIDER_SITE_OTHER): Payer: BLUE CROSS/BLUE SHIELD | Admitting: Family Medicine

## 2016-06-21 ENCOUNTER — Encounter: Payer: Self-pay | Admitting: Family Medicine

## 2016-06-21 VITALS — BP 106/74 | HR 104 | Temp 98.2°F | Resp 16 | Wt 153.0 lb

## 2016-06-21 DIAGNOSIS — M5442 Lumbago with sciatica, left side: Secondary | ICD-10-CM | POA: Diagnosis not present

## 2016-06-21 DIAGNOSIS — M545 Low back pain: Secondary | ICD-10-CM | POA: Diagnosis not present

## 2016-06-21 NOTE — Telephone Encounter (Signed)
Patient was advised she wants more detail from the report. Patient asked what exactly is mild spinal curvature and can this be corrected? KW

## 2016-06-21 NOTE — Telephone Encounter (Signed)
Discussed normal x-ray and "gentle curvature" not a cause for alarm.

## 2016-06-21 NOTE — Progress Notes (Signed)
Subjective:     Patient ID: Nancy Hanna, female   DOB: [birth year], 41 y.o.   MRN: [id number]  HPI  Chief Complaint  Patient presents with  . Back Pain    Left low back pain x 2 days. Pt has tried ice and ibuprofen, with some relief. Pt was seen on 05/20/2016 for acute left sided low back pain with sciatica, and was put on Prednisone, with relief. Pt reports the pain feels similar.  States she lifted her child while in the car seat prior to the onset of back pain. Reports pain "not as bad as before" but does radiate down her ankle.   Review of Systems     Objective:   Physical Exam  Constitutional: She appears well-developed and well-nourished. No distress.  Musculoskeletal:  Muscle strength in lower extremities 5/5. SLR's to 90 degrees without radiation of back pain. Reports increased pain in left ischial area. Localizes back pain to left SI area.       Assessment:    1. Acute left-sided low back pain with left-sided sciatica: recurrent - DG Lumbar Spine Complete; Future    Plan:    Wishes to hold off on prednisone. Will continue Advil and add Tylenol E.S. Further f/u and possible orthopedic referral pending x-ray report.

## 2016-06-21 NOTE — Patient Instructions (Addendum)
May Tylenol extra strength 500 mg.two pills 3 x day along with Ibuprofen 400 mg. Every 4-6 hours. We will call you with the x-ray report.

## 2016-06-21 NOTE — Telephone Encounter (Signed)
-----   Message from Anola Gurneyobert Chauvin, [place] sent at 06/21/2016  2:01 PM EST ----- Mild spinal curvature but no significant bony abnormality. Give your back two weeks to get better. If getting worse or not improving call for prednisone and/or orthopedic referral.

## 2018-05-19 DIAGNOSIS — Z23 Encounter for immunization: Secondary | ICD-10-CM | POA: Diagnosis not present

## 2018-05-30 IMAGING — CR DG LUMBAR SPINE COMPLETE 4+V
1 series · 5 of 5 positions shown · non-contrast
Comparison: None in PACs

CLINICAL DATA: Left low back pain for the past month radiating down
the left lower extremity without known injury.

EXAM:
LUMBAR SPINE - COMPLETE 4+ VIEW

[Series 1: dg lumbar spine complete 4 +v · 0.14mm/px · 5 of 5 slices shown]
[im 1/5]
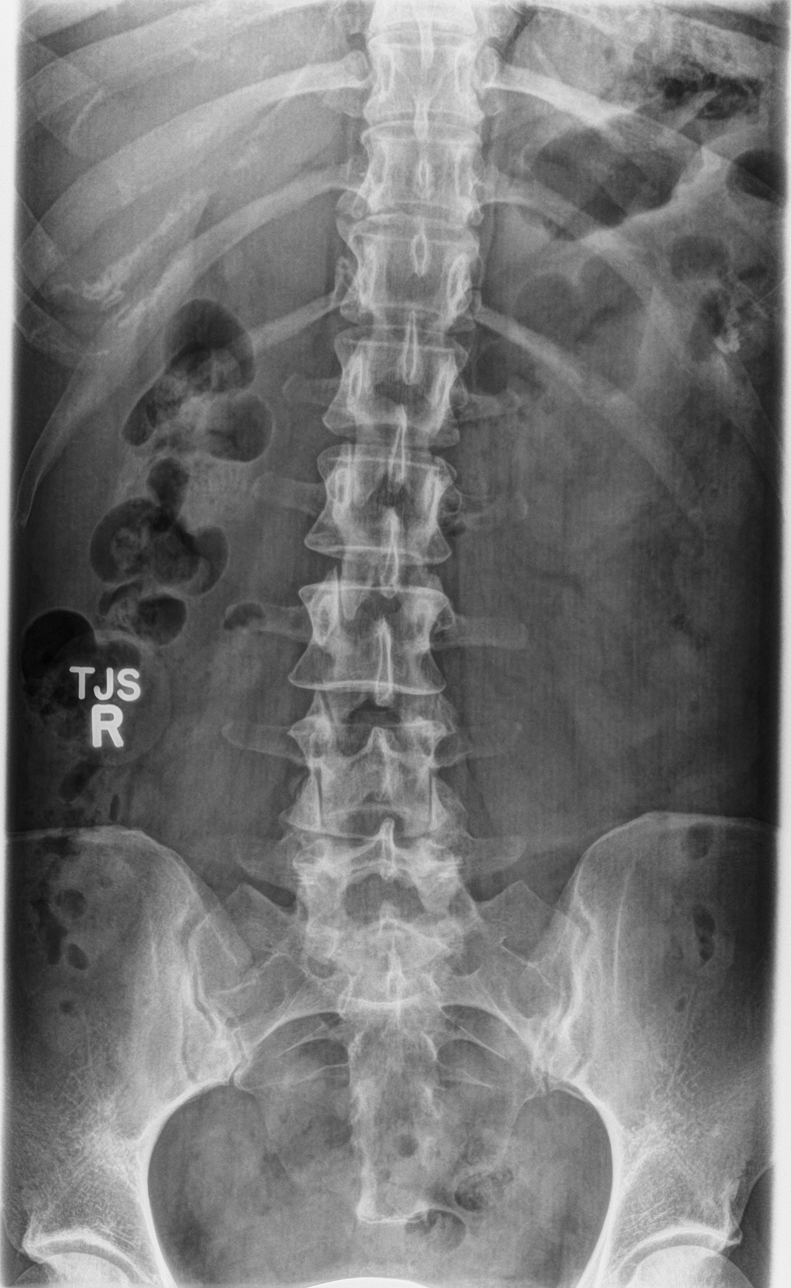
[im 2/5]
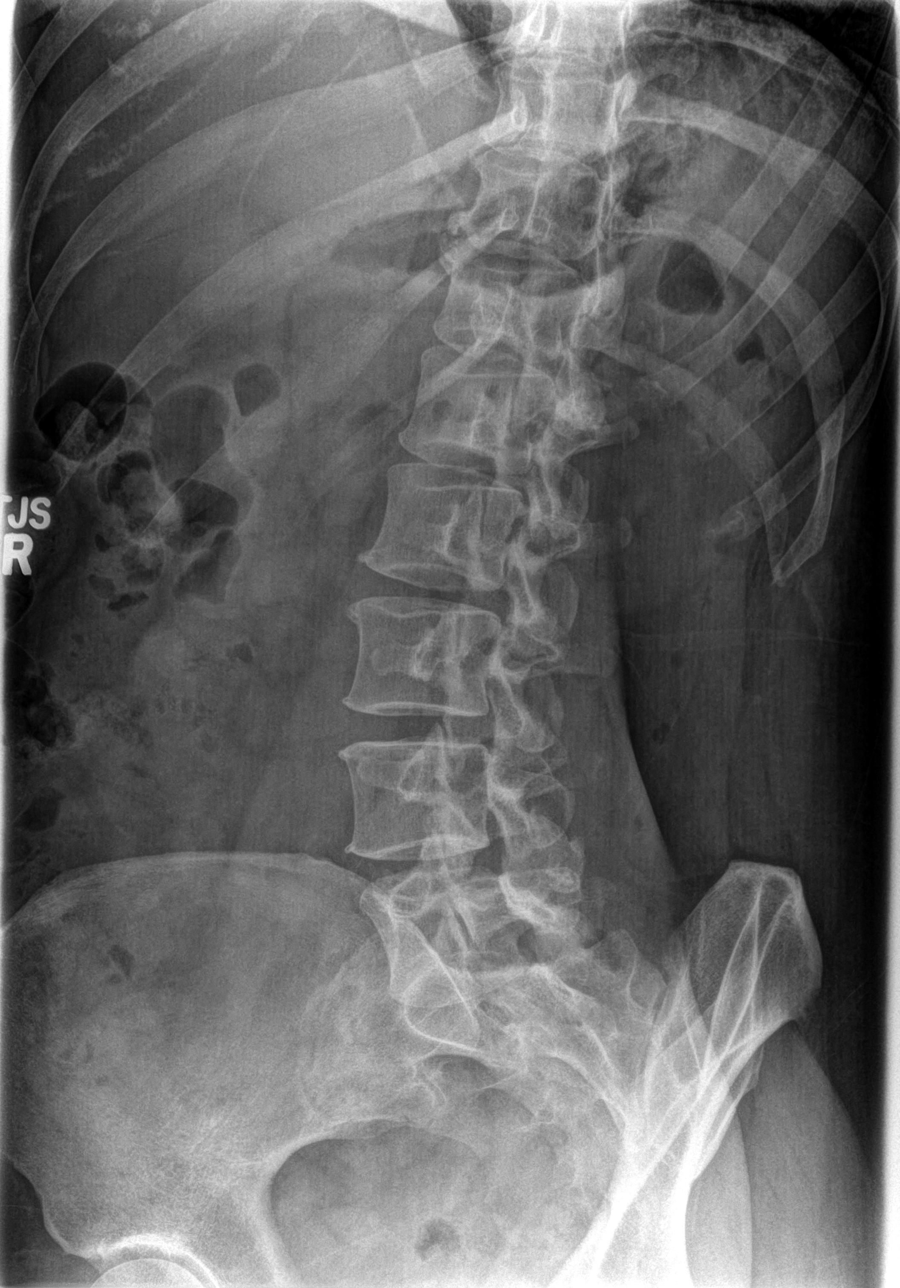
[im 3/5]
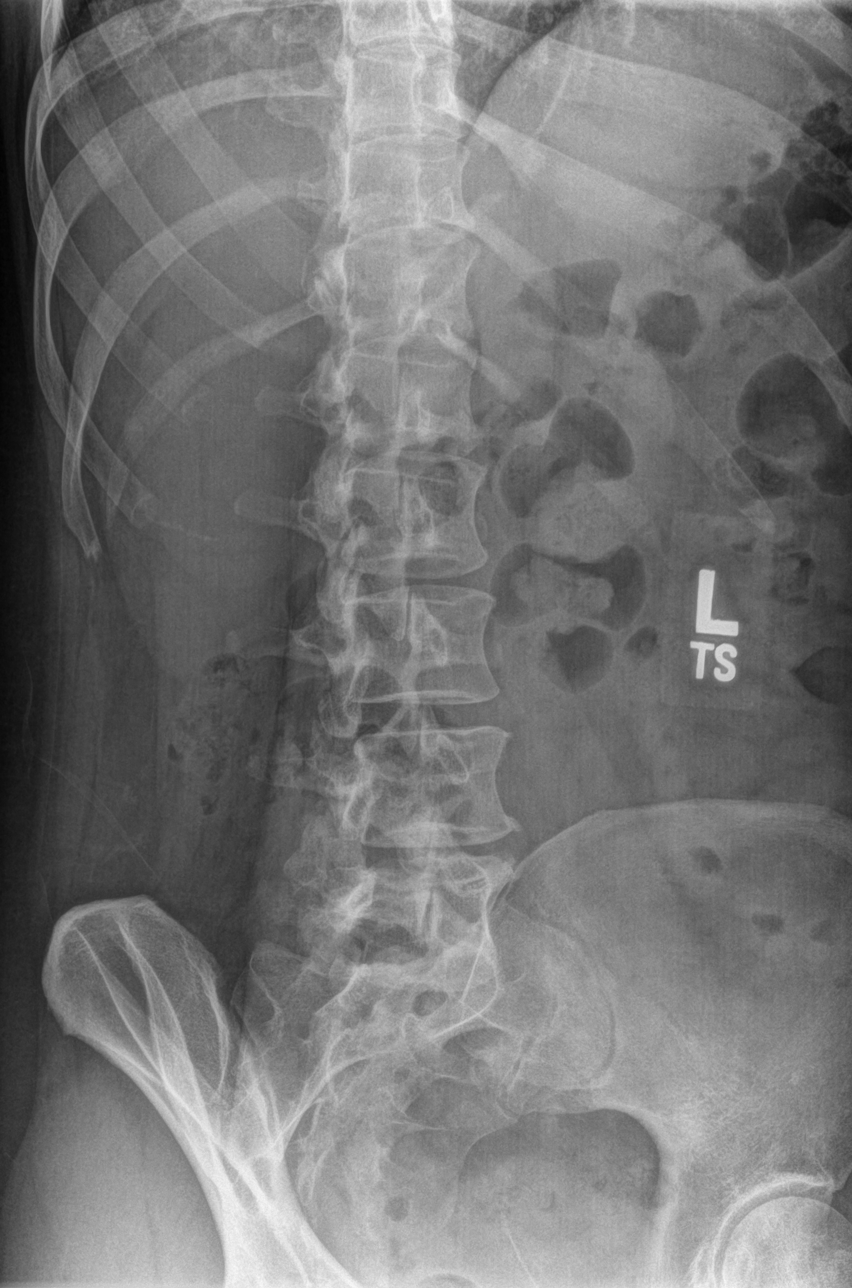
[im 4/5]
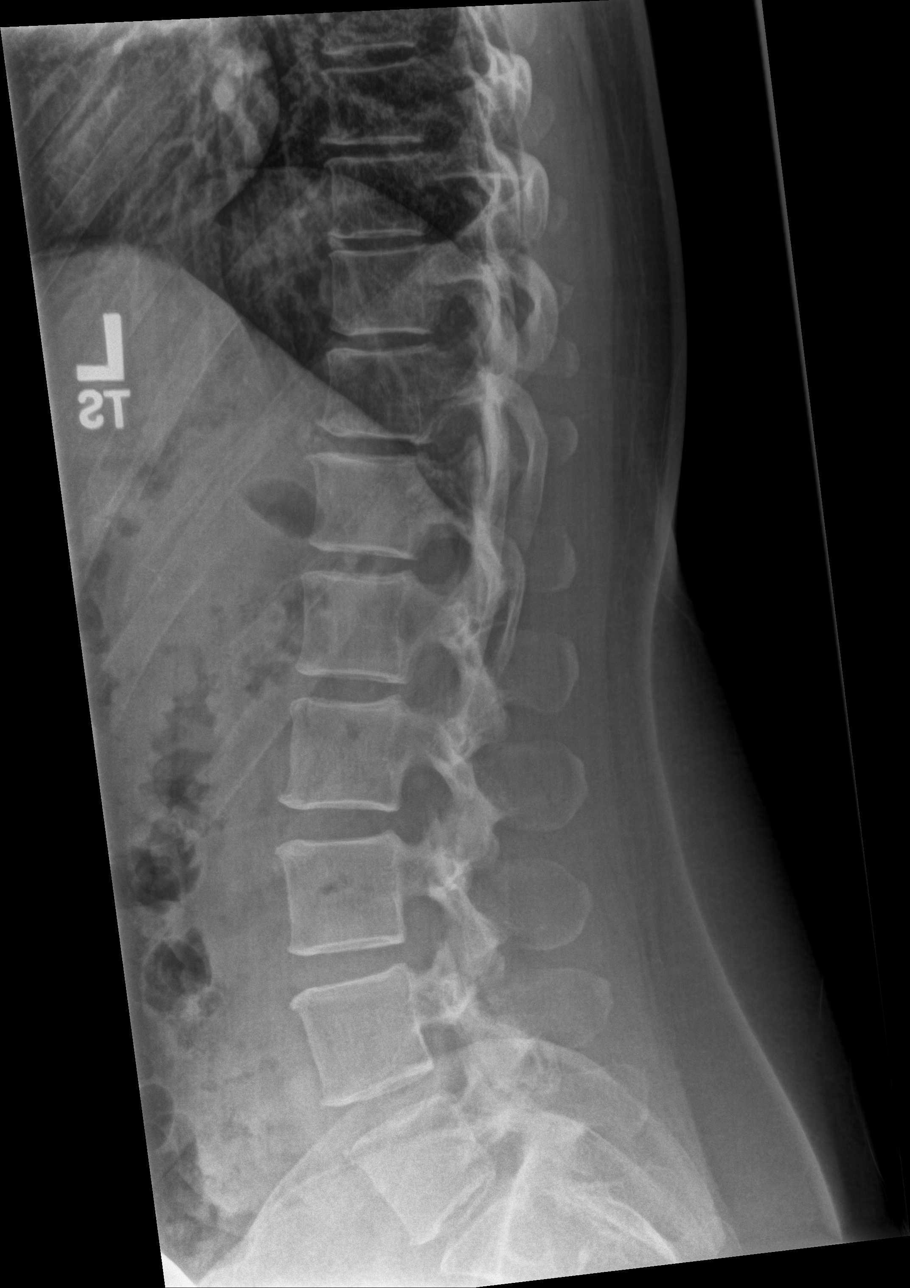
[im 5/5]
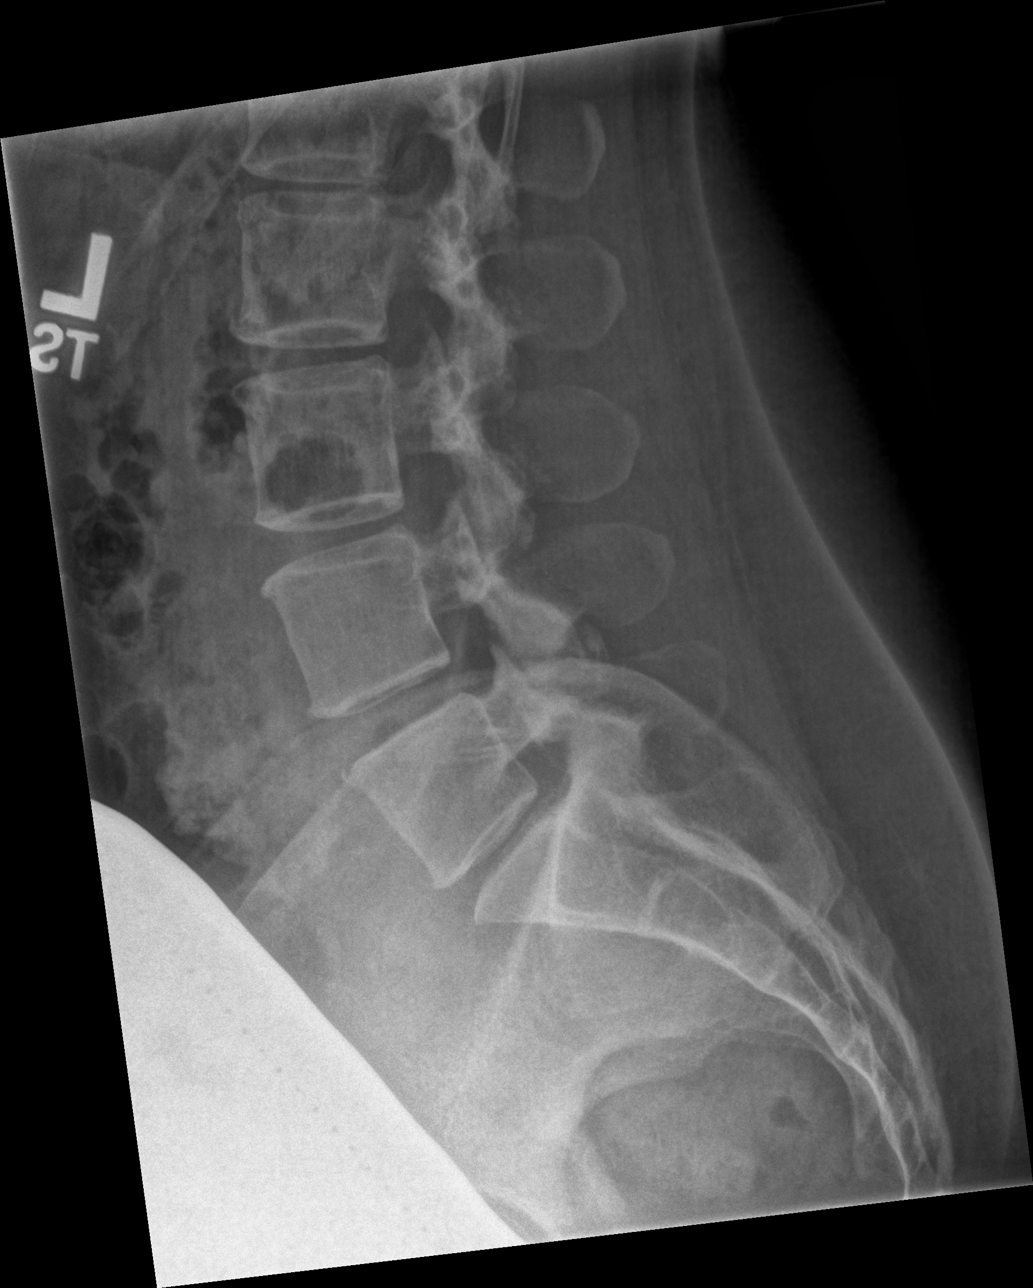

[5 of 5 positions shown; findings below may reference images not displayed]

FINDINGS: The lumbar vertebral bodies are preserved in height. There is gentle
dextrocurvature centered at L4. The pedicles and transverse
processes are intact. The disc space heights are well maintained.
There is no spondylolisthesis. There is no significant facet joint
hypertrophy. The observed portions of the sacrum are normal.
IMPRESSION: There is no acute or significant chronic bony abnormality of the
lumbar spine.

## 2018-11-14 ENCOUNTER — Telehealth: Payer: Self-pay | Admitting: *Deleted

## 2018-11-14 ENCOUNTER — Ambulatory Visit (INDEPENDENT_AMBULATORY_CARE_PROVIDER_SITE_OTHER): Payer: BC Managed Care – PPO | Admitting: Physician Assistant

## 2018-11-14 DIAGNOSIS — Z20822 Contact with and (suspected) exposure to covid-19: Secondary | ICD-10-CM

## 2018-11-14 DIAGNOSIS — R509 Fever, unspecified: Secondary | ICD-10-CM

## 2018-11-14 NOTE — Progress Notes (Signed)
       Patient: Nancy Hanna Female    DOB: 05-17-1975   43 y.o.   MRN: [id number] Visit Date: 11/14/2018  Today's Provider: Trinna Post, PA-C   Chief Complaint  Patient presents with  . Headache   Subjective:    Virtual Visit via Video Note  I connected with Nancy Hanna on 11/14/18 at  3:40 PM EDT by a video enabled telemedicine application and verified that I am speaking with the correct person using two identifiers.   I discussed the limitations of evaluation and management by telemedicine and the availability of in person appointments. The patient expressed understanding and agreed to proceed.  Patient location: home Provider location: [place] office  Persons involved in the visit: patient, provider    HPI   Reports she had a low grade temperature of 40F, feels fatigued. Denies runny nose, denies sore throat but some neck tenderness. 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 Reports she was at a family gathering of 9 people, both indoors and outdoors.  No Known Allergies   Current Outpatient Medications:  .  ibuprofen (ADVIL,MOTRIN) 600 MG tablet, Take 1 tablet (600 mg total) by mouth every 6 (six) hours., Disp: 30 tablet, Rfl: 1 .  Multiple Vitamins-Minerals (WOMENS ONE DAILY PO), Take by mouth., Disp: , Rfl:   Review of Systems  Social History   Tobacco Use  . Smoking status: Never Smoker  . Smokeless tobacco: Never Used  Substance Use Topics  . Alcohol use: No      Objective:   There were no vitals taken for this visit. There were no vitals filed for this visit.   Physical Exam Constitutional:      Appearance: She is well-developed. She is not ill-appearing.  Pulmonary:     Effort: No respiratory distress.  Neurological:     Mental Status: She is alert.      No results found for any visits on 11/14/18.     Assessment & Plan    1. Fever, unspecified fever cause  Will send for  COVID testing. Counseled on isolation and symptomatic treatment. Counseled on return precautions.   The entirety of the information documented in the History of Present Illness, Review of Systems and Physical Exam were personally obtained by me. Portions of this information were initially documented by Lynford Humphrey, CMA and reviewed by me for thoroughness and accuracy.   F/u PRN       Trinna Post, PA-C  [facility]

## 2018-11-14 NOTE — Telephone Encounter (Signed)
Pt referred for covid-19 testing by Carles Collet, PA .  Pt was notified and scheduled for testing at the [facility] at 12 noon tomorrow. advised that this is a drive thru testing site and to stay in car with mask on and windows rolled up until ready to be tested. Pt voiced understanding.

## 2018-11-15 ENCOUNTER — Other Ambulatory Visit: Payer: BLUE CROSS/BLUE SHIELD

## 2018-11-15 DIAGNOSIS — R6889 Other general symptoms and signs: Secondary | ICD-10-CM | POA: Diagnosis not present

## 2018-11-15 DIAGNOSIS — Z20822 Contact with and (suspected) exposure to covid-19: Secondary | ICD-10-CM

## 2018-11-16 NOTE — Patient Instructions (Signed)
This information is directly available on the CDC website: https://www.cdc.gov/coronavirus/2019-ncov/if-you-are-sick/steps-when-sick.html    Source:CDC Reference to specific commercial products, manufacturers, companies, or trademarks does not constitute its endorsement or recommendation by the U.S. Government, Department of Health and Human Services, or Centers for Disease Control and Prevention.  

## 2018-11-20 LAB — NOVEL CORONAVIRUS, NAA: SARS-CoV-2, NAA: NOT DETECTED

## 2018-11-28 ENCOUNTER — Telehealth: Payer: Self-pay | Admitting: Family Medicine

## 2018-11-28 NOTE — Telephone Encounter (Signed)
Patient called already got her results via my chart

## 2019-04-26 DIAGNOSIS — Z01419 Encounter for gynecological examination (general) (routine) without abnormal findings: Secondary | ICD-10-CM | POA: Diagnosis not present

## 2019-04-26 DIAGNOSIS — Z6830 Body mass index (BMI) 30.0-30.9, adult: Secondary | ICD-10-CM | POA: Diagnosis not present

## 2019-04-26 DIAGNOSIS — Z1231 Encounter for screening mammogram for malignant neoplasm of breast: Secondary | ICD-10-CM | POA: Diagnosis not present

## 2019-04-26 DIAGNOSIS — Z1151 Encounter for screening for human papillomavirus (HPV): Secondary | ICD-10-CM | POA: Diagnosis not present

## 2019-04-30 LAB — HM PAP SMEAR: HM Pap smear: NEGATIVE

## 2019-04-30 LAB — RESULTS CONSOLE HPV: CHL HPV: NEGATIVE

## 2019-10-18 ENCOUNTER — Encounter: Payer: Self-pay | Admitting: Physician Assistant

## 2019-11-13 NOTE — Progress Notes (Signed)
Complete physical exam   Patient: Nancy Hanna   DOB: [birth year]   44 y.o. Female  MRN: [id number] Visit Date: 11/14/2019  Today's healthcare provider: Trey Sailors, PA-C   Chief Complaint  Patient presents with  . Annual Exam   Subjective    Nancy Hanna is a 44 y.o. female who presents today for a complete physical exam.  She reports consuming a general diet. Home exercise routine includes treadmill. She generally feels fairly well. She reports sleeping fairly well. She does not have additional problems to discuss today. She reports she gets PAP smear with her OBGYN Dr. Billy Coast at [facility]. Reports she had one recently and it was normal.     Past Medical History:  Diagnosis Date  . Hx of varicella   . Postpartum care following repeat cesarean delivery (5/1) 09/08/2015   Past Surgical History:  Procedure Laterality Date  . CESAREAN SECTION    . CESAREAN SECTION N/A 09/08/2015   Procedure: Repeat CESAREAN SECTION;  Surgeon: Olivia Mackie, MD;  Location: [facility];  Service: Obstetrics;  Laterality: N/A;  EDD: 09/15/15 Felix Ahmadi to [facility]   Social History   Socioeconomic History  . Marital status: Married    Spouse name: Not on file  . Number of children: Not on file  . Years of education: Not on file  . Highest education level: Not on file  Occupational History  . Not on file  Tobacco Use  . Smoking status: Never Smoker  . Smokeless tobacco: Never Used  Substance and Sexual Activity  . Alcohol use: No  . Drug use: No  . Sexual activity: Yes  Other Topics Concern  . Not on file  Social History Narrative  . Not on file   Social Determinants of Health   Financial Resource Strain:   . Difficulty of Paying Living Expenses:   Food Insecurity:   . Worried About Programme researcher, broadcasting/film/video in the Last Year:   . Barista in the Last Year:   Transportation Needs:   . Freight forwarder (Medical):   Marland Kitchen Lack of Transportation (Non-Medical):    Physical Activity:   . Days of Exercise per Week:   . Minutes of Exercise per Session:   Stress:   . Feeling of Stress :   Social Connections:   . Frequency of Communication with Friends and Family:   . Frequency of Social Gatherings with Friends and Family:   . Attends Religious Services:   . Active Member of Clubs or Organizations:   . Attends Banker Meetings:   Marland Kitchen Marital Status:   Intimate Partner Violence:   . Fear of Current or Ex-Partner:   . Emotionally Abused:   Marland Kitchen Physically Abused:   . Sexually Abused:    Family Status  Relation Name Status  . Father  Deceased  . Mother  (Not Specified)  . Brother  (Not Specified)  . MGM  (Not Specified)  . MGF  (Not Specified)  . PGM  (Not Specified)  . Brother  (Not Specified)   Family History  Problem Relation Age of Onset  . Diabetes Father   . Heart attack Father   . Hypothyroidism Mother   . Cancer Mother        skin  . Migraines Mother   . Drug abuse Brother   . Bipolar disorder Brother   . Alcohol abuse Brother   . Drug abuse Maternal Grandmother   .  COPD Maternal Grandmother   . Cancer Maternal Grandmother        breast  . Migraines Maternal Grandmother   . Heart attack Maternal Grandfather   . Heart failure Maternal Grandfather   . Stroke Maternal Grandfather   . Hypertension Maternal Grandfather   . Rheum arthritis Paternal Grandmother   . Depression Paternal Grandmother   . Seizures Brother    No Known Allergies  Patient Care Team: Anola Gurney, [place] as PCP - General (Family Medicine)   Medications: Outpatient Medications Prior to Visit  Medication Sig  . ibuprofen (ADVIL,MOTRIN) 600 MG tablet Take 1 tablet (600 mg total) by mouth every 6 (six) hours.  . Multiple Vitamins-Minerals (WOMENS ONE DAILY PO) Take by mouth.   No facility-administered medications prior to visit.    Review of Systems  Constitutional: Negative.   HENT: Negative.   Eyes: Negative.   Respiratory: Negative.    Cardiovascular: Negative.   Gastrointestinal: Negative.   Endocrine: Negative.   Genitourinary: Negative.   Musculoskeletal: Negative.   Skin: Negative.   Allergic/Immunologic: Negative.   Neurological: Negative.   Hematological: Negative.   Psychiatric/Behavioral: Negative.       Objective    BP 112/72   Pulse 72   Temp 98.3 F (36.8 C)   Resp 16   Ht 5\' 2"  (1.575 m)   Wt 136 lb (61.7 kg)   LMP 11/10/2019   BMI 24.87 kg/m    Physical Exam Constitutional:      Appearance: Normal appearance.  HENT:     Right Ear: Tympanic membrane normal.     Left Ear: Tympanic membrane normal.  Eyes:     Extraocular Movements: Extraocular movements intact.     Conjunctiva/sclera: Conjunctivae normal.     Pupils: Pupils are equal, round, and reactive to light.  Cardiovascular:     Rate and Rhythm: Normal rate and regular rhythm.     Pulses: Normal pulses.     Heart sounds: Normal heart sounds.  Pulmonary:     Effort: Pulmonary effort is normal.     Breath sounds: Normal breath sounds.  Abdominal:     General: Bowel sounds are normal.  Skin:    General: Skin is warm and dry.  Neurological:     Mental Status: She is alert and oriented to person, place, and time. Mental status is at baseline.  Psychiatric:        Mood and Affect: Mood normal.        Behavior: Behavior normal.       Last depression screening scores PHQ 2/9 Scores 11/14/2019  PHQ - 2 Score 0   Last fall risk screening Fall Risk  11/14/2019  Falls in the past year? 0  Number falls in past yr: 0  Injury with Fall? 0  Risk for fall due to : No Fall Risks  Follow up Falls evaluation completed   Last Audit-C alcohol use screening Alcohol Use Disorder Test (AUDIT) 11/14/2019  1. How often do you have a drink containing alcohol? 0  2. How many drinks containing alcohol do you have on a typical day when you are drinking? 0  3. How often do you have six or more drinks on one occasion? 0  AUDIT-C Score 0   Alcohol Brief Interventions/Follow-up AUDIT Score <7 follow-up not indicated   A score of 3 or more in women, and 4 or more in men indicates increased risk for alcohol abuse, EXCEPT if all of the points are from question  1   No results found for any visits on 11/14/19.  Assessment & Plan    1. Annual physical exam Counseled on Covid vaccine. Patient declines at this time. Requesting PAP smear from [facility].  - CBC with Differential/Platelet - Comprehensive metabolic panel - TSH - Lipid panel  2. Need for hepatitis C screening test - Hepatitis C antibody   Routine Health Maintenance and Physical Exam  Exercise Activities and Dietary recommendations Goals   None     Immunization History  Administered Date(s) Administered  . Tdap 06/23/2015    Health Maintenance  Topic Date Due  . Hepatitis C Screening  Never done  . PAP SMEAR-Modifier  Never done  . INFLUENZA VACCINE  12/09/2019  . TETANUS/TDAP  06/22/2025  . HIV Screening  Completed    Discussed health benefits of physical activity, and encouraged her to engage in regular exercise appropriate for her age and condition.   Return in about 1 year (around 11/13/2020).     ITrey Sailors, PA-C, have reviewed all documentation for this visit. The documentation on 11/15/19 for the exam, diagnosis, procedures, and orders are all accurate and complete.    Maryella Shivers  [facility] [phone] (phone) [phone] (fax)  [facility]

## 2019-11-14 ENCOUNTER — Ambulatory Visit (INDEPENDENT_AMBULATORY_CARE_PROVIDER_SITE_OTHER): Payer: BC Managed Care – PPO | Admitting: Physician Assistant

## 2019-11-14 ENCOUNTER — Encounter: Payer: Self-pay | Admitting: Physician Assistant

## 2019-11-14 ENCOUNTER — Other Ambulatory Visit: Payer: Self-pay

## 2019-11-14 VITALS — BP 112/72 | HR 72 | Temp 98.3°F | Resp 16 | Ht 62.0 in | Wt 136.0 lb

## 2019-11-14 DIAGNOSIS — Z1159 Encounter for screening for other viral diseases: Secondary | ICD-10-CM

## 2019-11-14 DIAGNOSIS — Z0000 Encounter for general adult medical examination without abnormal findings: Secondary | ICD-10-CM | POA: Diagnosis not present

## 2020-03-11 DIAGNOSIS — Z1159 Encounter for screening for other viral diseases: Secondary | ICD-10-CM | POA: Diagnosis not present

## 2020-03-11 DIAGNOSIS — Z0000 Encounter for general adult medical examination without abnormal findings: Secondary | ICD-10-CM | POA: Diagnosis not present

## 2020-03-12 LAB — COMPREHENSIVE METABOLIC PANEL
ALT: 8 IU/L (ref 0–32)
AST: 10 IU/L (ref 0–40)
Albumin/Globulin Ratio: 1.7 (ref 1.2–2.2)
Albumin: 4.5 g/dL (ref 3.8–4.8)
Alkaline Phosphatase: 66 IU/L (ref 44–121)
BUN/Creatinine Ratio: 20 (ref 9–23)
BUN: 14 mg/dL (ref 6–24)
Bilirubin Total: 0.4 mg/dL (ref 0.0–1.2)
CO2: 24 mmol/L (ref 20–29)
Calcium: 9.1 mg/dL (ref 8.7–10.2)
Chloride: 101 mmol/L (ref 96–106)
Creatinine, Ser: 0.69 mg/dL (ref 0.57–1.00)
GFR calc Af Amer: 123 mL/min/{1.73_m2} (ref >59)
GFR calc non Af Amer: 107 mL/min/{1.73_m2} (ref >59)
Globulin, Total: 2.7 g/dL (ref 1.5–4.5)
Glucose: 94 mg/dL (ref 65–99)
Potassium: 4.7 mmol/L (ref 3.5–5.2)
Sodium: 137 mmol/L (ref 134–144)
Total Protein: 7.2 g/dL (ref 6.0–8.5)

## 2020-03-12 LAB — CBC WITH DIFFERENTIAL/PLATELET
Basophils Absolute: 0.1 10*3/uL (ref 0.0–0.2)
Basos: 1 %
EOS (ABSOLUTE): 0.1 10*3/uL (ref 0.0–0.4)
Eos: 2 %
Hematocrit: 42 % (ref 34.0–46.6)
Hemoglobin: 13.7 g/dL (ref 11.1–15.9)
Immature Grans (Abs): 0 10*3/uL (ref 0.0–0.1)
Immature Granulocytes: 0 %
Lymphocytes Absolute: 1.4 10*3/uL (ref 0.7–3.1)
Lymphs: 25 %
MCH: 30.3 pg (ref 26.6–33.0)
MCHC: 32.6 g/dL (ref 31.5–35.7)
MCV: 93 fL (ref 79–97)
Monocytes Absolute: 0.4 10*3/uL (ref 0.1–0.9)
Monocytes: 7 %
Neutrophils Absolute: 3.6 10*3/uL (ref 1.4–7.0)
Neutrophils: 65 %
Platelets: 207 10*3/uL (ref 150–450)
RBC: 4.52 x10E6/uL (ref 3.77–5.28)
RDW: 12 % (ref 11.7–15.4)
WBC: 5.6 10*3/uL (ref 3.4–10.8)

## 2020-03-12 LAB — TSH: TSH: 1.35 u[IU]/mL (ref 0.450–4.500)

## 2020-03-12 LAB — LIPID PANEL
Chol/HDL Ratio: 3.1 ratio (ref 0.0–4.4)
Cholesterol, Total: 197 mg/dL (ref 100–199)
HDL: 64 mg/dL (ref >39)
LDL Chol Calc (NIH): 121 mg/dL — ABNORMAL HIGH (ref 0–99)
Triglycerides: 65 mg/dL (ref 0–149)
VLDL Cholesterol Cal: 12 mg/dL (ref 5–40)

## 2020-03-12 LAB — HEPATITIS C ANTIBODY: Hep C Virus Ab: <0.1 s/co ratio (ref 0.0–0.9)

## 2020-03-13 NOTE — Progress Notes (Signed)
Patient reviewed results via MyChart 

## 2020-07-24 DIAGNOSIS — Z124 Encounter for screening for malignant neoplasm of cervix: Secondary | ICD-10-CM | POA: Diagnosis not present

## 2020-07-24 DIAGNOSIS — Z1231 Encounter for screening mammogram for malignant neoplasm of breast: Secondary | ICD-10-CM | POA: Diagnosis not present

## 2020-07-24 DIAGNOSIS — Z01419 Encounter for gynecological examination (general) (routine) without abnormal findings: Secondary | ICD-10-CM | POA: Diagnosis not present

## 2020-07-24 DIAGNOSIS — Z01411 Encounter for gynecological examination (general) (routine) with abnormal findings: Secondary | ICD-10-CM | POA: Diagnosis not present

## 2020-07-24 DIAGNOSIS — Z6825 Body mass index (BMI) 25.0-25.9, adult: Secondary | ICD-10-CM | POA: Diagnosis not present

## 2020-11-14 ENCOUNTER — Encounter: Payer: Self-pay | Admitting: Physician Assistant

## 2021-03-05 ENCOUNTER — Telehealth: Payer: Self-pay | Admitting: Physician Assistant

## 2021-03-05 ENCOUNTER — Ambulatory Visit: Payer: Self-pay | Admitting: *Deleted

## 2021-03-05 DIAGNOSIS — H1031 Unspecified acute conjunctivitis, right eye: Secondary | ICD-10-CM

## 2021-03-05 MED ORDER — POLYMYXIN B-TRIMETHOPRIM 10000-0.1 UNIT/ML-% OP SOLN: 1.0000 [drp] | OPHTHALMIC | 0 refills | Status: DC

## 2021-03-05 NOTE — Telephone Encounter (Signed)
Reason for Disposition  [1] MILD eye pain or discomfort AND [2] wears contacts  Answer Assessment - Initial Assessment Questions 1. EYE DISCHARGE: "Is the discharge in one or both eyes?" "What color is it?" "How much is there?" "When did the discharge start?"      Right eye drainage is yellow.  It's a little red.  No blurry vision 2. REDNESS OF SCLERA: "Is the redness in one or both eyes?" "When did the redness start?"      A little redness that started a few hours ago.  No eyelid swelling or redness just my eyeball is a little red and watery. 3. EYELIDS: "Are the eyelids red or swollen?" If Yes, ask: "How much?"      See above 4. VISION: "Is there any difficulty seeing clearly?"      No 5. PAIN: "Is there any pain? If Yes, ask: "How bad is it?" (Scale 1-10; or mild, moderate, severe)    - MILD (1-3): doesn't interfere with normal activities     - MODERATE (4-7): interferes with normal activities or awakens from sleep    - SEVERE (8-10): excruciating pain, unable to do any normal activities       Burning a little. 6. CONTACT LENS: "Do you wear contacts?"     Yes (She is picking up her kids while talking to me).   I do have them in.    7. OTHER SYMPTOMS: "Do you have any other symptoms?" (e.g., fever, runny nose, cough)     No fever, or runny nose.   I'm having drainage down my throat and my voice is hoarse. 8. PREGNANCY: "Is there any chance you are pregnant?" "When was your last menstrual period?"     No  Protocols used: Eye - Pus or Discharge-A-AH

## 2021-03-05 NOTE — Telephone Encounter (Signed)
I returned pt's call.   She had called in c/o having possible pink eye.   She's had it before.   A few hours ago she started having yellow drainage from her right eye and the eye is a little red and watery.   It burns a little.   No change in vision.   She does have contact lenses in.   I encouraged her to remove them as soon as she could.   (She was picking up her kids when I called).  I called into [facility] to see if there were any appts available.   The next available appt was with Merita Norton on Monday Oct. 31.   She preferred to be seen before then so mentioned she will go to the urgent care instead of waiting.   I let her know that was a good idea because if she had pink eye it needed to be treated. I went over washing her hands good after touching her eyes and before and that if she had pink eye it's contagious.   She verbalized understanding and will go on to the urgent care.

## 2021-03-05 NOTE — Patient Instructions (Signed)
[name], thank you for joining Nancy Loveless, PA-C for today's virtual visit.  While this provider is not your primary care provider (PCP), if your PCP is located in our provider database this encounter information will be shared with them immediately following your visit.  Consent: (Patient) Nancy Hanna provided verbal consent for this virtual visit at the beginning of the encounter.  Current Medications:  Current Outpatient Medications:    trimethoprim-polymyxin b (POLYTRIM) ophthalmic solution, Place 1 drop into the right eye every 4 (four) hours., Disp: 10 mL, Rfl: 0   ibuprofen (ADVIL,MOTRIN) 600 MG tablet, Take 1 tablet (600 mg total) by mouth every 6 (six) hours., Disp: 30 tablet, Rfl: 1   Multiple Vitamins-Minerals (WOMENS ONE DAILY PO), Take by mouth., Disp: , Rfl:    Medications ordered in this encounter:  Meds ordered this encounter  Medications   trimethoprim-polymyxin b (POLYTRIM) ophthalmic solution    Sig: Place 1 drop into the right eye every 4 (four) hours.    Dispense:  10 mL    Refill:  0    Order Specific Question:   Supervising Provider    Answer:   Nancy Hanna, Nancy Hanna [[id number]]     *If you need refills on other medications prior to your next appointment, please contact your pharmacy*  Follow-Up: Call back or seek an in-person evaluation if the symptoms worsen or if the condition fails to improve as anticipated.  Other Instructions Bacterial Conjunctivitis, Adult Bacterial conjunctivitis is an infection of the clear membrane that covers the white part of the eye and the inner surface of the eyelid (conjunctiva). When the blood vessels in the conjunctiva become inflamed, the eye becomes red or pink. The eye often feels irritated or itchy. Bacterial conjunctivitis spreads easily from person to person (is contagious). It also spreads easily from one eye to the other eye. What are the causes? This condition is caused by bacteria. You may get the  infection if you come into close contact with: A person who is infected with the bacteria. Items that are contaminated with the bacteria, such as a face towel, contact lens solution, or eye makeup. What increases the risk? You are more likely to develop this condition if: You are exposed to other people who have the infection. You wear contact lenses. You have a sinus infection. You have had a recent eye injury or surgery. You have a weak body defense system (immune system). You have a medical condition that causes dry eyes. What are the signs or symptoms? Symptoms of this condition include: Thick, yellowish discharge from the eye. This may turn into a crust on the eyelid overnight and cause your eyelids to stick together. Tearing or watery eyes. Itchy eyes. Burning feeling in your eyes. Eye redness. Swollen eyelids. Blurred vision. How is this diagnosed? This condition is diagnosed based on your symptoms and medical history. Your health care provider may also take a sample of discharge from your eye to find the cause of your infection. How is this treated? This condition may be treated with: Antibiotic eye drops or ointment to clear the infection more quickly and prevent the spread of infection to others. Antibiotic medicines taken by mouth (orally) to treat infections that do not respond to drops or ointments or that last longer than 10 days. Cool, wet cloths (cool compresses) placed on the eyes. Artificial tears applied 2-6 times a day. Follow these instructions at home: Medicines Take or apply your antibiotic medicine as told by your  health care provider. Do not stop using the antibiotic, even if your condition improves, unless directed by your health care provider. Take or apply over-the-counter and prescription medicines only as told by your health care provider. Be very careful to avoid touching the edge of your eyelid with the eye-drop bottle or the ointment tube when you  apply medicines to the affected eye. This will keep you from spreading the infection to your other eye or to other people. Managing discomfort Gently wipe away any drainage from your eye with a warm, wet washcloth or a cotton ball. Apply a clean, cool compress to your eye for 10-20 minutes, 3-4 times a day. General instructions Do not wear contact lenses until the inflammation is gone and your health care provider says it is safe to wear them again. Ask your health care provider how to sterilize or replace your contact lenses before you use them again. Wear glasses until you can resume wearing contact lenses. Avoid wearing eye makeup until the inflammation is gone. Throw away any old eye cosmetics that may be contaminated. Change or wash your pillowcase every day. Do not share towels or washcloths. This may spread the infection. Wash your hands often with soap and water for at least 20 seconds and especially before touching your face or eyes. Use paper towels to dry your hands. Avoid touching or rubbing your eyes. Do not drive or use heavy machinery if your vision is blurred. Contact a health care provider if: You have a fever. Your symptoms do not get better after 10 days. Get help right away if: You have a fever and your symptoms suddenly get worse. You have severe pain when you move your eye. You have facial pain, redness, or swelling. You have a sudden loss of vision. Summary Bacterial conjunctivitis is an infection of the clear membrane that covers the white part of the eye and the inner surface of the eyelid (conjunctiva). Bacterial conjunctivitis spreads easily from eye to eye and from person to person (is contagious). Wash your hands often with soap and water for at least 20 seconds and especially before touching your face or eyes. Use paper towels to dry your hands. Take or apply your antibiotic medicine as told by your health care provider. Do not stop using the antibiotic even if  your condition improves. Contact a health care provider if you have a fever or if your symptoms do not get better after 10 days. Get help right away if you have a sudden loss of vision. This information is not intended to replace advice given to you by your health care provider. Make sure you discuss any questions you have with your health care provider. Document Revised: 08/06/2020 Document Reviewed: 08/06/2020 Elsevier Patient Education  2022 [employer].    If you have been instructed to have an in-person evaluation today at a local Urgent Care facility, please use the link below. It will take you to a list of all of our available [facility], including address, phone number and hours of operation. Please do not delay care.  [facility]  If you or a family member do not have a primary care provider, use the link below to schedule a visit and establish care. When you choose a [facility] primary care physician or advanced practice provider, you gain a long-term partner in health. Find a Primary Care Provider  Learn more about [facility] in-office and virtual care options:  - Get Care  Now

## 2021-03-05 NOTE — Progress Notes (Signed)
Virtual Visit Consent   [name], you are scheduled for a virtual visit with a [facility] provider today.     Just as with appointments in the office, your consent must be obtained to participate.  Your consent will be active for this visit and any virtual visit you may have with one of our providers in the next 365 days.     If you have a MyChart account, a copy of this consent can be sent to you electronically.  All virtual visits are billed to your insurance company just like a traditional visit in the office.    As this is a virtual visit, video technology does not allow for your provider to perform a traditional examination.  This may limit your provider's ability to fully assess your condition.  If your provider identifies any concerns that need to be evaluated in person or the need to arrange testing (such as labs, EKG, etc.), we will make arrangements to do so.     Although advances in technology are sophisticated, we cannot ensure that it will always work on either your end or our end.  If the connection with a video visit is poor, the visit may have to be switched to a telephone visit.  With either a video or telephone visit, we are not always able to ensure that we have a secure connection.     I need to obtain your verbal consent now.   Are you willing to proceed with your visit today?    FAREEDA DOWNARD has provided verbal consent on 03/05/2021 for a virtual visit (video or telephone).   Margaretann Loveless, PA-C   Date: 03/05/2021 7:06 PM   Virtual Visit via Video Note   I, Margaretann Loveless, connected with  Nancy Hanna  ([id number], [birth year]) on 03/05/21 at  7:15 PM EDT by a video-enabled telemedicine application and verified that I am speaking with the correct person using two identifiers.  Location: Patient: Virtual Visit Location Patient: Home Provider: Virtual Visit Location Provider: Home Office   I discussed the limitations of evaluation and  management by telemedicine and the availability of in person appointments. The patient expressed understanding and agreed to proceed.    History of Present Illness: Nancy Hanna is a 44 y.o. who identifies as a female who was assigned female at birth, and is being seen today for possible pink eye.  HPI: Conjunctivitis  The current episode started today. The onset was sudden. The problem occurs continuously. The problem has been gradually worsening. The problem is mild. Associated symptoms include eye itching, eye discharge (purulent) and eye redness. Pertinent negatives include no fever, no decreased vision, no double vision, no photophobia, no congestion, no sore throat (hoarse voice), no muscle aches, no neck stiffness and no eye pain. The eye pain is mild. The right eye is affected. The eye pain is not associated with movement. The eyelid exhibits no abnormality.   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  Problems:  Patient Active Problem List   Diagnosis Date Noted   Previous cesarean section 09/08/2015   Postpartum care following repeat cesarean delivery (5/1) 09/08/2015    Allergies: No Known Allergies Medications:  Current Outpatient Medications:    trimethoprim-polymyxin b (POLYTRIM) ophthalmic solution, Place 1 drop into the right eye every 4 (four) hours., Disp: 10 mL, Rfl: 0   ibuprofen (ADVIL,MOTRIN) 600 MG tablet, Take 1 tablet (600 mg total) by mouth  every 6 (six) hours., Disp: 30 tablet, Rfl: 1   Multiple Vitamins-Minerals (WOMENS ONE DAILY PO), Take by mouth., Disp: , Rfl:   Observations/Objective: Patient is well-developed, well-nourished in no acute distress.  Resting comfortably at home.  Head is normocephalic, atraumatic.  No labored breathing.  Speech is clear and coherent with logical content.  Patient is alert and oriented at baseline.  Right eye is red and appears slightly swollen  Assessment and Plan: 1. Acute bacterial  conjunctivitis of right eye - trimethoprim-polymyxin b (POLYTRIM) ophthalmic solution; Place 1 drop into the right eye every 4 (four) hours.  Dispense: 10 mL; Refill: 0  - Suspect conjunctivitis - Polytrim prescribed - Good hand hygiene - Warm compresses - Should not return to work until antibiotic has been in system for 24 hours to avoid further spread - Seek in person evaluation if symptoms fail to improve or worsen  Follow Up Instructions: I discussed the assessment and treatment plan with the patient. The patient was provided an opportunity to ask questions and all were answered. The patient agreed with the plan and demonstrated an understanding of the instructions.  A copy of instructions were sent to the patient via MyChart unless otherwise noted below.    The patient was advised to call back or seek an in-person evaluation if the symptoms worsen or if the condition fails to improve as anticipated.  Time:  I spent 11 minutes with the patient via telehealth technology discussing the above problems/concerns.    Margaretann Loveless, PA-C

## 2021-11-03 DIAGNOSIS — Z6829 Body mass index (BMI) 29.0-29.9, adult: Secondary | ICD-10-CM | POA: Diagnosis not present

## 2021-11-03 DIAGNOSIS — Z01419 Encounter for gynecological examination (general) (routine) without abnormal findings: Secondary | ICD-10-CM | POA: Diagnosis not present

## 2021-11-03 DIAGNOSIS — Z0142 Encounter for cervical smear to confirm findings of recent normal smear following initial abnormal smear: Secondary | ICD-10-CM | POA: Diagnosis not present

## 2021-11-03 DIAGNOSIS — Z01411 Encounter for gynecological examination (general) (routine) with abnormal findings: Secondary | ICD-10-CM | POA: Diagnosis not present

## 2021-11-03 DIAGNOSIS — Z1231 Encounter for screening mammogram for malignant neoplasm of breast: Secondary | ICD-10-CM | POA: Diagnosis not present

## 2021-11-03 DIAGNOSIS — Z124 Encounter for screening for malignant neoplasm of cervix: Secondary | ICD-10-CM | POA: Diagnosis not present

## 2022-01-20 ENCOUNTER — Ambulatory Visit (INDEPENDENT_AMBULATORY_CARE_PROVIDER_SITE_OTHER): Payer: BC Managed Care – PPO | Admitting: Physician Assistant

## 2022-01-20 ENCOUNTER — Encounter: Payer: Self-pay | Admitting: Physician Assistant

## 2022-01-20 VITALS — BP 136/77 | HR 89 | Ht 62.5 in | Wt 170.8 lb

## 2022-01-20 DIAGNOSIS — Z0000 Encounter for general adult medical examination without abnormal findings: Secondary | ICD-10-CM

## 2022-01-20 DIAGNOSIS — E782 Mixed hyperlipidemia: Secondary | ICD-10-CM

## 2022-01-20 DIAGNOSIS — E669 Obesity, unspecified: Secondary | ICD-10-CM

## 2022-01-20 DIAGNOSIS — Z111 Encounter for screening for respiratory tuberculosis: Secondary | ICD-10-CM | POA: Diagnosis not present

## 2022-01-20 DIAGNOSIS — Z23 Encounter for immunization: Secondary | ICD-10-CM | POA: Diagnosis not present

## 2022-01-20 DIAGNOSIS — Z6830 Body mass index (BMI) 30.0-30.9, adult: Secondary | ICD-10-CM | POA: Diagnosis not present

## 2022-01-20 NOTE — Progress Notes (Signed)
I,Sha'taria Tyson,acting as a Neurosurgeon for [employer], PA-C.,have documented all relevant documentation on the behalf of Alfredia Ferguson, PA-C,as directed by  Alfredia Ferguson, PA-C while in the presence of Alfredia Ferguson, PA-C.   Complete physical exam   Patient: Nancy Hanna   DOB: [birth year]   46 y.o. Female  MRN: [id number] Visit Date: 01/20/2022  Today's healthcare provider: Alfredia Ferguson, PA-C   Cc. cpe  Subjective    Nancy Hanna is a 46 y.o. female who presents today for a complete physical exam.  She reports consuming a general diet. The patient does not participate in regular exercise at present. She generally feels well. She reports sleeping well. She does not have additional problems to discuss today.    Past Medical History:  Diagnosis Date   Hx of varicella    Postpartum care following repeat cesarean delivery (5/1) 09/08/2015   Past Surgical History:  Procedure Laterality Date   CESAREAN SECTION     CESAREAN SECTION N/A 09/08/2015   Procedure: Repeat CESAREAN SECTION;  Surgeon: Olivia Mackie, MD;  Location: [facility];  Service: Obstetrics;  Laterality: N/A;  EDD: 09/15/15 Felix Ahmadi to RNFA   Social History   Socioeconomic History   Marital status: Married    Spouse name: Not on file   Number of children: Not on file   Years of education: Not on file   Highest education level: Not on file  Occupational History   Not on file  Tobacco Use   Smoking status: Never   Smokeless tobacco: Never  Substance and Sexual Activity   Alcohol use: No   Drug use: No   Sexual activity: Yes  Other Topics Concern   Not on file  Social History Narrative   Not on file   Social Determinants of Health   Financial Resource Strain: Not on file  Food Insecurity: Not on file  Transportation Needs: Not on file  Physical Activity: Not on file  Stress: Not on file  Social Connections: Not on file  Intimate Partner Violence: Not on file   Family Status   Relation Name Status   Father  Deceased   Mother  (Not Specified)   Brother  (Not Specified)   MGM  (Not Specified)   MGF  (Not Specified)   PGM  (Not Specified)   Brother  (Not Specified)   Family History  Problem Relation Age of Onset   Diabetes Father    Heart attack Father    Hypothyroidism Mother    Cancer Mother        skin   Migraines Mother    Drug abuse Brother    Bipolar disorder Brother    Alcohol abuse Brother    Drug abuse Maternal Grandmother    COPD Maternal Grandmother    Cancer Maternal Grandmother        breast   Migraines Maternal Grandmother    Heart attack Maternal Grandfather    Heart failure Maternal Grandfather    Stroke Maternal Grandfather    Hypertension Maternal Grandfather    Rheum arthritis Paternal Grandmother    Depression Paternal Grandmother    Seizures Brother    No Known Allergies  Patient Care Team: Alfredia Ferguson, PA-C as PCP - General (Physician Assistant) Obgyn, [facility]   Medications: Outpatient Medications Prior to Visit  Medication Sig   ibuprofen (ADVIL,MOTRIN) 600 MG tablet Take 1 tablet (600 mg total) by mouth every 6 (six) hours.   Multiple Vitamins-Minerals (WOMENS ONE  DAILY PO) Take by mouth.   [DISCONTINUED] trimethoprim-polymyxin b (POLYTRIM) ophthalmic solution Place 1 drop into the right eye every 4 (four) hours. (Patient not taking: Reported on 01/20/2022)   No facility-administered medications prior to visit.    Review of Systems  Constitutional:  Negative for fatigue and fever.  Respiratory:  Negative for cough and shortness of breath.   Cardiovascular:  Negative for chest pain and leg swelling.  Gastrointestinal:  Negative for abdominal pain.  Neurological:  Negative for dizziness and headaches.      Objective     BP 136/77 (BP Location: Left Arm, Patient Position: Sitting, Cuff Size: Normal)   Pulse 89   Ht 5' 2.5" (1.588 m)   Wt 170 lb 12.8 oz (77.5 kg)   SpO2 100%   Breastfeeding No   BMI  30.74 kg/m     Physical Exam Constitutional:      General: She is awake.     Appearance: She is well-developed. She is not ill-appearing.  HENT:     Head: Normocephalic.     Right Ear: Tympanic membrane normal.     Left Ear: Tympanic membrane normal.     Nose: Nose normal. No congestion or rhinorrhea.     Mouth/Throat:     Pharynx: No oropharyngeal exudate or posterior oropharyngeal erythema.  Eyes:     Conjunctiva/sclera: Conjunctivae normal.     Pupils: Pupils are equal, round, and reactive to light.  Neck:     Thyroid: No thyroid mass or thyromegaly.  Cardiovascular:     Rate and Rhythm: Normal rate and regular rhythm.     Heart sounds: Normal heart sounds.  Pulmonary:     Effort: Pulmonary effort is normal.     Breath sounds: Normal breath sounds.  Abdominal:     Palpations: Abdomen is soft.     Tenderness: There is no abdominal tenderness.  Musculoskeletal:     Right lower leg: No swelling. No edema.     Left lower leg: No swelling. No edema.  Lymphadenopathy:     Cervical: No cervical adenopathy.  Skin:    General: Skin is warm.  Neurological:     Mental Status: She is alert and oriented to person, place, and time.  Psychiatric:        Attention and Perception: Attention normal.        Mood and Affect: Mood normal.        Speech: Speech normal.        Behavior: Behavior normal. Behavior is cooperative.      Last depression screening scores    01/20/2022    1:09 PM 11/14/2019   10:38 AM  PHQ 2/9 Scores  PHQ - 2 Score 0 0  PHQ- 9 Score 0    Last fall risk screening    01/20/2022    1:09 PM  Fall Risk   Falls in the past year? 0  Number falls in past yr: 0  Injury with Fall? 0  Risk for fall due to : No Fall Risks  Follow up Falls evaluation completed   Last Audit-C alcohol use screening    01/20/2022    1:09 PM  Alcohol Use Disorder Test (AUDIT)  1. How often do you have a drink containing alcohol? 1  2. How many drinks containing alcohol do you  have on a typical day when you are drinking? 0  3. How often do you have six or more drinks on one occasion? 0  AUDIT-C Score 1  A score of 3 or more in women, and 4 or more in men indicates increased risk for alcohol abuse, EXCEPT if all of the points are from question 1   No results found for any visits on 01/20/22.  Assessment & Plan    Routine Health Maintenance and Physical Exam  Exercise Activities and Dietary recommendations --balanced diet high in fiber and protein, low in sugars, carbs, fats. --physical activity/exercise 30 minutes 3-5 times a week     Immunization History  Administered Date(s) Administered   Influenza,inj,Quad PF,6+ Mos 01/20/2022   Tdap 06/23/2015    Health Maintenance  Topic Date Due   MAMMOGRAM  Never done   COLONOSCOPY (Pts 45-62yrs Insurance coverage will need to be confirmed)  Never done   PAP SMEAR-Modifier  04/29/2022   TETANUS/TDAP  06/22/2025   INFLUENZA VACCINE  Completed   Hepatitis C Screening  Completed   HIV Screening  Completed   HPV VACCINES  Aged Out    Discussed health benefits of physical activity, and encouraged her to engage in regular exercise appropriate for her age and condition.  Problem List Items Addressed This Visit   None Visit Diagnoses     Annual physical exam    -  Primary   Relevant Orders   CBC with Differential/Platelet   HgB A1c   TSH   Moderate mixed hyperlipidemia not requiring statin therapy       Relevant Orders   Comprehensive metabolic panel   Lipid Profile   Class 1 obesity without serious comorbidity with body mass index (BMI) of 30.0 to 30.9 in adult, unspecified obesity type       Relevant Orders   CBC with Differential/Platelet   HgB A1c   TSH   Screening-pulmonary TB       Relevant Orders   QuantiFERON-TB Gold Plus   Need for immunization against influenza       Relevant Orders   Flu Vaccine QUAD 44mo+IM (Fluarix, Fluzone & Alfiuria Quad PF) (Completed)      Pt declines  colonoscopy for now, will consider this upcoming summer. Offered cologuard pt declines. Pt follows with GYN outside of [facility] will obtain records for Paps / mammograms.  Return in about 1 year (around 01/21/2023) for CPE.     I, Alfredia Ferguson, PA-C have reviewed all documentation for this visit. The documentation on  01/20/2022 for the exam, diagnosis, procedures, and orders are all accurate and complete.  Alfredia Ferguson, PA-C [facility] [address] [place], [place], [address] Office: [phone] Fax: [phone]   [facility]

## 2022-01-22 LAB — CBC WITH DIFFERENTIAL/PLATELET
Basophils Absolute: 0.1 10*3/uL (ref 0.0–0.2)
Basos: 1 %
Hematocrit: 36.8 % (ref 34.0–46.6)
Immature Granulocytes: 0 %
Lymphs: 23 %
MCH: 30.5 pg (ref 26.6–33.0)
MCHC: 34 g/dL (ref 31.5–35.7)
MCV: 90 fL (ref 79–97)
Monocytes Absolute: 0.5 10*3/uL (ref 0.1–0.9)
Monocytes: 7 %
Neutrophils Absolute: 4.5 10*3/uL (ref 1.4–7.0)
Platelets: 220 10*3/uL (ref 150–450)
RDW: 12 % (ref 11.7–15.4)

## 2022-01-22 LAB — QUANTIFERON-TB GOLD PLUS

## 2022-01-22 LAB — COMPREHENSIVE METABOLIC PANEL
AST: 17 IU/L (ref 0–40)
Albumin/Globulin Ratio: 2.1 (ref 1.2–2.2)
Alkaline Phosphatase: 84 IU/L (ref 44–121)
BUN/Creatinine Ratio: 12 (ref 9–23)
Bilirubin Total: 0.6 mg/dL (ref 0.0–1.2)
CO2: 23 mmol/L (ref 20–29)
Creatinine, Ser: 0.78 mg/dL (ref 0.57–1.00)
Globulin, Total: 2.1 g/dL (ref 1.5–4.5)
Glucose: 116 mg/dL — ABNORMAL HIGH (ref 70–99)
Potassium: 4 mmol/L (ref 3.5–5.2)
Sodium: 139 mmol/L (ref 134–144)
Total Protein: 6.6 g/dL (ref 6.0–8.5)
eGFR: 95 mL/min/{1.73_m2} (ref >59)

## 2022-01-22 LAB — TSH: TSH: 0.858 u[IU]/mL (ref 0.450–4.500)

## 2022-01-22 LAB — HEMOGLOBIN A1C
Est. average glucose Bld gHb Est-mCnc: 108 mg/dL
Hgb A1c MFr Bld: 5.4 % (ref 4.8–5.6)

## 2022-01-23 LAB — CBC WITH DIFFERENTIAL/PLATELET
EOS (ABSOLUTE): 0.1 10*3/uL (ref 0.0–0.4)
Eos: 2 %
Hemoglobin: 12.5 g/dL (ref 11.1–15.9)
Immature Grans (Abs): 0 10*3/uL (ref 0.0–0.1)
Lymphocytes Absolute: 1.5 10*3/uL (ref 0.7–3.1)
Neutrophils: 67 %
RBC: 4.1 x10E6/uL (ref 3.77–5.28)
WBC: 6.7 10*3/uL (ref 3.4–10.8)

## 2022-01-23 LAB — COMPREHENSIVE METABOLIC PANEL
ALT: 14 IU/L (ref 0–32)
Albumin: 4.5 g/dL (ref 3.9–4.9)
BUN: 9 mg/dL (ref 6–24)
Calcium: 9.6 mg/dL (ref 8.7–10.2)
Chloride: 99 mmol/L (ref 96–106)

## 2022-01-23 LAB — QUANTIFERON-TB GOLD PLUS: QuantiFERON-TB Gold Plus: NEGATIVE

## 2022-04-26 ENCOUNTER — Encounter: Payer: Self-pay | Admitting: Physician Assistant

## 2022-04-26 ENCOUNTER — Ambulatory Visit (INDEPENDENT_AMBULATORY_CARE_PROVIDER_SITE_OTHER): Payer: BC Managed Care – PPO | Admitting: Physician Assistant

## 2022-04-26 VITALS — BP 108/81 | HR 130 | Temp 99.1°F | Wt 168.7 lb

## 2022-04-26 DIAGNOSIS — J029 Acute pharyngitis, unspecified: Secondary | ICD-10-CM

## 2022-04-26 DIAGNOSIS — J020 Streptococcal pharyngitis: Secondary | ICD-10-CM

## 2022-04-26 LAB — POCT RAPID STREP A (OFFICE): Rapid Strep A Screen: POSITIVE — AB

## 2022-04-26 MED ORDER — AMOXICILLIN 500 MG PO CAPS: 500.0000 mg | ORAL_CAPSULE | Freq: Two times a day (BID) | ORAL | 0 refills | Status: AC

## 2022-04-26 NOTE — Progress Notes (Signed)
I,Connie R Striblin,acting as a Neurosurgeon for [employer], PA-C.,have documented all relevant documentation on the behalf of Alfredia Ferguson, PA-C,as directed by  Alfredia Ferguson, PA-C while in the presence of Alfredia Ferguson, PA-C.   Established patient visit   Patient: Nancy Hanna   DOB: 1975-12-12   46 y.o. Female  MRN: [id number] Visit Date: 04/26/2022  Today's healthcare provider: Alfredia Ferguson, PA-C   Cc. Sore throat, body aches x 3 days   Subjective    HPI  Sore Throat: Patient complains of sore throat. Symptoms began 3 days ago. Fever is absent. Other associated symptoms have included chills, headache, and body aches .  Fluid intake is good.  There has been contact with an individual with known strep.  Current medications include acetaminophen, ibuprofen.     Medications: Outpatient Medications Prior to Visit  Medication Sig   ibuprofen (ADVIL,MOTRIN) 600 MG tablet Take 1 tablet (600 mg total) by mouth every 6 (six) hours.   Multiple Vitamins-Minerals (WOMENS ONE DAILY PO) Take by mouth.   No facility-administered medications prior to visit.    Review of Systems  Constitutional:  Positive for chills and fatigue. Negative for fever.  HENT:  Positive for sore throat.   Respiratory:  Negative for cough and shortness of breath.   Cardiovascular:  Negative for chest pain and leg swelling.  Gastrointestinal:  Negative for abdominal pain.  Neurological:  Positive for headaches. Negative for dizziness.      Objective    Blood pressure 108/81, pulse (!) 130, temperature 99.1 F (37.3 C), temperature source Oral, weight 168 lb 11.2 oz (76.5 kg), SpO2 99 %.   Physical Exam Constitutional:      General: She is awake.     Appearance: She is well-developed.  HENT:     Head: Normocephalic.     Mouth/Throat:     Pharynx: Posterior oropharyngeal erythema present. No oropharyngeal exudate.     Tonsils: No tonsillar exudate or tonsillar abscesses.  Eyes:      Conjunctiva/sclera: Conjunctivae normal.  Cardiovascular:     Rate and Rhythm: Regular rhythm. Tachycardia present.     Heart sounds: Normal heart sounds.  Pulmonary:     Effort: Pulmonary effort is normal. No respiratory distress.     Breath sounds: Normal breath sounds.  Skin:    General: Skin is warm.  Neurological:     Mental Status: She is alert and oriented to person, place, and time.  Psychiatric:        Attention and Perception: Attention normal.        Mood and Affect: Mood normal.        Speech: Speech normal.        Behavior: Behavior is cooperative.     Results for orders placed or performed in visit on 04/26/22  POCT rapid strep A  Result Value Ref Range   Rapid Strep A Screen Positive (A) Negative    Assessment & Plan     Acute strep pharyngitis Poc strep A positive Rx amoxicillin 500 mg bid x 10 days Increase fluids, tylenol   Return if symptoms worsen or fail to improve.      I, Alfredia Ferguson, PA-C have reviewed all documentation for this visit. The documentation on  04/26/2022  for the exam, diagnosis, procedures, and orders are all accurate and complete.  Alfredia Ferguson, PA-C [facility] [address] [place], [place], [address] Office: [phone] Fax: [phone]   [facility]

## 2022-05-14 ENCOUNTER — Ambulatory Visit (INDEPENDENT_AMBULATORY_CARE_PROVIDER_SITE_OTHER): Payer: BC Managed Care – PPO | Admitting: Family Medicine

## 2022-05-14 ENCOUNTER — Encounter: Payer: Self-pay | Admitting: Family Medicine

## 2022-05-14 VITALS — BP 142/81 | HR 104 | Ht 62.0 in | Wt 168.5 lb

## 2022-05-14 DIAGNOSIS — H6692 Otitis media, unspecified, left ear: Secondary | ICD-10-CM

## 2022-05-14 MED ORDER — AZITHROMYCIN 250 MG PO TABS: ORAL_TABLET | ORAL | 0 refills | Status: AC

## 2022-05-14 NOTE — Progress Notes (Signed)
     I,Sha'taria Tyson,acting as a Education administrator for Lelon Huh, MD.,have documented all relevant documentation on the behalf of Lelon Huh, MD,as directed by  Lelon Huh, MD while in the presence of Lelon Huh, MD.   Established patient visit   Patient: Nancy Hanna   DOB: December 10, 1975   47 y.o. Female  MRN: [id number] Visit Date: 05/14/2022  Today's healthcare provider: Lelon Huh, MD    Subjective    Otalgia  There is pain in the left ear. This is a new problem. The current episode started yesterday. The problem occurs every few hours. The problem has been unchanged. There has been no fever. The pain is at a severity of 7/10. Associated symptoms include coughing. Associated symptoms comments: Nasal congestion and post nasal drip. She has tried acetaminophen (decongestant, mucinex) for the symptoms. The treatment provided no relief.      Medications: Outpatient Medications Prior to Visit  Medication Sig   ibuprofen (ADVIL,MOTRIN) 600 MG tablet Take 1 tablet (600 mg total) by mouth every 6 (six) hours.   Multiple Vitamins-Minerals (WOMENS ONE DAILY PO) Take by mouth.   No facility-administered medications prior to visit.    Review of Systems  HENT:  Positive for ear pain.   Respiratory:  Positive for cough.       Objective    There were no vitals taken for this visit.   Physical Exam   General Appearance:    Mildly obese female, alert, cooperative, in no acute distress  HENT:   right TM normal without fluid or infection and left TM red, dull, bulging  Eyes:    PERRL, conjunctiva/corneas clear, EOM's intact       Lungs:     Clear to auscultation bilaterally, respirations unlabored  Heart:    Tachycardic. Normal rhythm. No murmurs, rubs, or gallops.    Neurologic:   Awake, alert, oriented x 3. No apparent focal neurological           defect.         Assessment & Plan     1. Left otitis media, unspecified otitis media type  - azithromycin (ZITHROMAX) 250  MG tablet; Take 2 tablets on day 1, then 1 tablet daily on days 2 through 5  Dispense: 6 tablet; Refill: 0   Call if symptoms change or if not rapidly improving.        The entirety of the information documented in the History of Present Illness, Review of Systems and Physical Exam were personally obtained by me. Portions of this information were initially documented by the CMA and reviewed by me for thoroughness and accuracy.     Lelon Huh, MD  [facility] [phone] (phone) [phone] (fax)  [place]

## 2023-01-26 DIAGNOSIS — Z1231 Encounter for screening mammogram for malignant neoplasm of breast: Secondary | ICD-10-CM | POA: Diagnosis not present

## 2023-03-28 ENCOUNTER — Encounter: Payer: Self-pay | Admitting: Family Medicine

## 2023-03-28 ENCOUNTER — Ambulatory Visit (INDEPENDENT_AMBULATORY_CARE_PROVIDER_SITE_OTHER): Payer: BC Managed Care – PPO | Admitting: Family Medicine

## 2023-03-28 VITALS — BP 120/61 | HR 88 | Resp 16 | Ht 62.0 in | Wt 170.0 lb

## 2023-03-28 DIAGNOSIS — R399 Unspecified symptoms and signs involving the genitourinary system: Secondary | ICD-10-CM

## 2023-03-28 LAB — POC COVID19/FLU A&B COMBO
Covid Antigen, POC: NEGATIVE
Influenza A Antigen, POC: NEGATIVE
Influenza B Antigen, POC: NEGATIVE

## 2023-03-28 MED ORDER — HYDROCODONE BIT-HOMATROP MBR 5-1.5 MG/5ML PO SOLN: 5.0000 mL | Freq: Three times a day (TID) | ORAL | 0 refills | Status: DC | PRN

## 2023-03-28 NOTE — Progress Notes (Signed)
      Established patient visit   Patient: Nancy Hanna   DOB: 1975/08/04   47 y.o. Female  MRN: [id number] Visit Date: 03/28/2023  Today's healthcare provider: Mila Merry, MD   Chief Complaint  Patient presents with   URI    Taken IBU   Subjective    Discussed the use of AI scribe software for clinical note transcription with the patient, who gave verbal consent to proceed.  History of Present Illness   The patient presented with a week-long history of upper respiratory symptoms. The symptoms began with a headache and general malaise, progressing to a cough and chills by the end of the week. The patient has been managing the symptoms with ibuprofen. The cough is particularly bothersome, described as audible and located in the chest. The patient denies any sore throat or nasal congestion, indicating the symptoms are primarily located in the chest. There is a past medical history of bronchitis, but no other known lung conditions. The patient did not measure her temperature but reported feeling feverish and sweaty, which she attributed to the ibuprofen's effect. There was a slight shortness of breath reported. The patient also experienced itchy eyes at the beginning of the week, which has since resolved. The patient received a flu shot last month. The patient's symptoms are worse at night, particularly when lying in bed. The patient denies any other symptoms or health concerns.       Medications: Outpatient Medications Prior to Visit  Medication Sig   ibuprofen (ADVIL,MOTRIN) 600 MG tablet Take 1 tablet (600 mg total) by mouth every 6 (six) hours.   Multiple Vitamins-Minerals (WOMENS ONE DAILY PO) Take by mouth.   No facility-administered medications prior to visit.   Review of Systems     Objective    BP 120/61 (BP Location: Left Arm, Patient Position: Sitting, Cuff Size: Large)   Pulse 88   Resp 16   Ht 5\' 2"  (1.575 m)   Wt 170 lb (77.1 kg)   SpO2 99%   BMI 31.09  kg/m   Physical Exam  Physical Exam   HEENT: Throat normal. CHEST: Lungs clear to auscultation.     Assessment & Plan        Upper Respiratory Infection Symptoms of cough, chills, and malaise started last Friday. No fever, sore throat, or nasal congestion. No history of asthma or bronchitis. Lungs clear on auscultation. Negative for flu and COVID-19. -Continue ibuprofen for symptomatic relief. -Use over-the-counter Robitussin or Delsym for daytime cough. -Prescribe hydrocodone cough syrup for nighttime cough, to be filled at [facility] [address]. Use only as needed due to drowsiness side effect. -Expect improvement within the week.    No follow-ups on file.      Mila Merry, MD  [facility] [phone] (phone) [phone] (fax)  [facility]

## 2023-05-05 DIAGNOSIS — Z01411 Encounter for gynecological examination (general) (routine) with abnormal findings: Secondary | ICD-10-CM | POA: Diagnosis not present

## 2023-05-05 DIAGNOSIS — Z1331 Encounter for screening for depression: Secondary | ICD-10-CM | POA: Diagnosis not present

## 2023-05-05 DIAGNOSIS — Z01419 Encounter for gynecological examination (general) (routine) without abnormal findings: Secondary | ICD-10-CM | POA: Diagnosis not present

## 2023-05-05 DIAGNOSIS — Z124 Encounter for screening for malignant neoplasm of cervix: Secondary | ICD-10-CM | POA: Diagnosis not present

## 2023-06-02 DIAGNOSIS — Z1212 Encounter for screening for malignant neoplasm of rectum: Secondary | ICD-10-CM | POA: Diagnosis not present

## 2023-06-02 DIAGNOSIS — Z1211 Encounter for screening for malignant neoplasm of colon: Secondary | ICD-10-CM | POA: Diagnosis not present

## 2023-06-10 LAB — COLOGUARD: COLOGUARD: NEGATIVE

## 2024-03-20 ENCOUNTER — Ambulatory Visit: Payer: Self-pay | Admitting: Nurse Practitioner

## 2024-03-20 VITALS — BP 118/86 | HR 77 | Temp 98.4°F | Ht 62.0 in | Wt 171.2 lb

## 2024-03-20 DIAGNOSIS — L729 Follicular cyst of the skin and subcutaneous tissue, unspecified: Secondary | ICD-10-CM

## 2024-03-20 MED ORDER — DOXYCYCLINE HYCLATE 100 MG PO TABS: 100.0000 mg | ORAL_TABLET | Freq: Two times a day (BID) | ORAL | 0 refills | Status: AC

## 2024-03-20 NOTE — Patient Instructions (Signed)
 Nice to see you today I have sent in an antibiotic to the pharmacy  Follow up if you do not improve  If you decide you want it removed let me know and we can send you to a surgeons office

## 2024-03-20 NOTE — Progress Notes (Signed)
 Established Patient Office Visit  Subjective   Patient ID: Nancy Hanna, female    DOB: 1975/12/02  Age: 48 y.o. MRN: [id number]  Chief Complaint  Patient presents with   Cyst    Pt complains of cyst on R shoulder that's been there for 10 years. States she had it drained once. Pt complains that now she can smell the cyst and want to get it checked out for possible infection. Noticed for a few weeks.     HPI  Discussed the use of AI scribe software for clinical note transcription with the patient, who gave verbal consent to proceed.  History of Present Illness Nancy Hanna is a 48 year old female who presents with a cyst on the right shoulder that has developed an odor.  She has had a cyst on her right shoulder for an extended period, characterized by a history of waxing and waning. Occasionally, it drains and then returns to its normal state. Years ago, the cyst became infected and required drainage.  Recently, she noticed an odor emanating from the cyst, although there is no visible drainage. Her shirt smells by the end of the day, but not in copious amounts. No pain, fever, or chills are associated with the cyst, although she occasionally feels tired and achy, which she describes as her normal state.  Her current medications include ibuprofen  and a one-a-day women's vitamin. She works as an comptroller in an engineer, [employer], which she finds tiresome due to being around many children.    Review of Systems  Constitutional:  Negative for chills and fever.  Respiratory:  Negative for shortness of breath.   Cardiovascular:  Negative for chest pain.  Skin:        + lesion  Neurological:  Negative for headaches.  Psychiatric/Behavioral:  Negative for hallucinations and suicidal ideas.       Objective:     BP 118/86   Pulse 77   Temp 98.4 F (36.9 C) (Oral)   Ht 5' 2 (1.575 m)   Wt 171 lb 3.2 oz (77.7 kg)   SpO2 99%   BMI 31.31 kg/m     Physical Exam Vitals and nursing note reviewed.  Constitutional:      Appearance: Normal appearance.  Cardiovascular:     Rate and Rhythm: Normal rate and regular rhythm.     Heart sounds: Normal heart sounds.  Pulmonary:     Effort: Pulmonary effort is normal.     Breath sounds: Normal breath sounds.  Skin:     Neurological:     Mental Status: She is alert.      No results found for any visits on 03/20/24.    The ASCVD Risk score (Arnett DK, et al., 2019) failed to calculate for the following reasons:   Cannot find a previous HDL lab   Cannot find a previous total cholesterol lab    Assessment & Plan:   Problem List Items Addressed This Visit   None Visit Diagnoses       Cyst of skin    -  Primary   Relevant Medications   doxycycline (VIBRA-TABS) 100 MG tablet     Assessment and Plan Assessment & Plan Right shoulder cyst with intermittent drainage Chronic cyst with mild redness. Possible bacterial involvement. Differential includes lipoma or cyst. - Prescribed doxycycline 100 mg twice daily for 7 days. - Advised on sun sensitivity with doxycycline; recommended sunscreen or protective clothing outdoors. Discussed pregnancy precautions - Discussed potential  referral to general surgeon for removal due to anatomical considerations. - Sent prescription to pharmacy.   Return if symptoms worsen or fail to improve.    Adina Crandall, NP
